# Patient Record
Sex: Female | Born: 2000
Health system: Southern US, Community
[De-identification: ages and names within clinical notes are randomized; demographics above are authoritative.]

## PROBLEM LIST (undated history)

## (undated) DIAGNOSIS — L309 Dermatitis, unspecified: Secondary | ICD-10-CM

## (undated) DIAGNOSIS — J020 Streptococcal pharyngitis: Secondary | ICD-10-CM

---
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
## 2021-02-20 ENCOUNTER — Emergency Department (HOSPITAL_BASED_OUTPATIENT_CLINIC_OR_DEPARTMENT_OTHER)
Admission: EM | Admit: 2021-02-20 | Discharge: 2021-02-20 | Disposition: A | Discharge status: Home / Self Care | Payer: 59 | Attending: Emergency Medicine | Admitting: Emergency Medicine

## 2021-02-20 ENCOUNTER — Other Ambulatory Visit: Payer: Self-pay

## 2021-02-20 DIAGNOSIS — J45909 Unspecified asthma, uncomplicated: Secondary | ICD-10-CM | POA: Insufficient documentation

## 2021-02-20 DIAGNOSIS — Z20822 Contact with and (suspected) exposure to covid-19: Secondary | ICD-10-CM | POA: Insufficient documentation

## 2021-02-20 DIAGNOSIS — R519 Headache, unspecified: Secondary | ICD-10-CM | POA: Insufficient documentation

## 2021-02-20 LAB — RESP PANEL BY RT-PCR (FLU A&B, COVID) ARPGX2
Influenza A by PCR: NEGATIVE
Influenza B by PCR: NEGATIVE
SARS Coronavirus 2 by RT PCR: NEGATIVE

## 2021-02-20 MED ORDER — ACETAMINOPHEN 325 MG PO TABS
650.0000 mg | ORAL_TABLET | Freq: Once | ORAL | Status: AC
  Administered 2021-02-20: 650 mg via ORAL
  Filled 2021-02-20: qty 2

## 2021-02-20 NOTE — ED Triage Notes (Signed)
Pt c/o headache, chills, fatigue & chest pain. States coworker recently had covid and was in contact with her.

## 2021-02-20 NOTE — ED Provider Notes (Signed)
[facility] EMERGENCY DEPARTMENT Provider Note   CSN: [id number] Arrival date & time: 02/20/21  6962     History Chief Complaint  Patient presents with   Headache    Summer Thornton is a 20 y.o. female.  The history is provided by the patient.  URI Presenting symptoms: fatigue   Presenting symptoms: no cough, no ear pain, no fever and no sore throat   Presenting symptoms comment:  Chills, fatigue, headache, exposure to covid  Severity:  Mild Onset quality:  Gradual Timing:  Intermittent Progression:  Waxing and waning Chronicity:  New Relieved by:  Nothing Worsened by:  Nothing Ineffective treatments:  None tried Associated symptoms: headaches and myalgias   Associated symptoms: no arthralgias   Risk factors: sick contacts       Past Medical History:  Diagnosis Date   Asthma    Eczema     There are no problems to display for this patient.   No past surgical history on file.   OB History   No obstetric history on file.     Family History  Problem Relation Age of Onset   Hypertension Maternal Grandmother    Fibroids Maternal Grandmother    Depression Maternal Grandmother    Breast cancer Paternal Grandmother     Social History   Tobacco Use   Smoking status: Never  Substance Use Topics   Alcohol use: No   Drug use: No    Home Medications Prior to Admission medications   Medication Sig Start Date End Date Taking? Authorizing Provider  albuterol (PROVENTIL HFA;VENTOLIN HFA) 108 (90 BASE) MCG/ACT inhaler Inhale 2 puffs into the lungs every 6 (six) hours as needed.   Patient not taking: Reported on 12/22/2019    [provider]  APRI 0.15-30 MG-MCG tablet Take 1 tablet by mouth daily. 12/17/19   [provider]  clindamycin (CLEOCIN) 300 MG capsule Take 1 capsule (300 mg total) by mouth 3 (three) times daily. X 7 days Patient not taking: Reported on 12/22/2019 02/16/17   Palumbo, April, MD  montelukast (SINGULAIR) 5 MG  chewable tablet Chew 5 mg by mouth at bedtime.   Patient not taking: Reported on 12/22/2019    [provider]  Vitamin D, Ergocalciferol, (DRISDOL) 1.25 MG (50000 UNIT) CAPS capsule Take 50,000 Units by mouth once a week. 11/09/19   [provider]    Allergies    Patient has no known allergies.  Review of Systems   Review of Systems  Constitutional:  Positive for fatigue. Negative for chills and fever.  HENT:  Negative for ear pain and sore throat.   Eyes:  Negative for pain and visual disturbance.  Respiratory:  Negative for cough and shortness of breath.   Cardiovascular:  Negative for chest pain and palpitations.  Gastrointestinal:  Negative for abdominal pain and vomiting.  Genitourinary:  Negative for dysuria and hematuria.  Musculoskeletal:  Positive for myalgias. Negative for arthralgias and back pain.  Skin:  Negative for color change and rash.  Neurological:  Positive for headaches. Negative for seizures and syncope.  All other systems reviewed and are negative.  Physical Exam Updated Vital Signs BP 125/72 (BP Location: Right Arm)   Pulse 67   Temp 98.5 F (36.9 C) (Oral)   Resp 18   Ht 5\' 4"  (1.626 m)   Wt 100.7 kg   LMP 01/28/2021   SpO2 98%   BMI 38.11 kg/m   Physical Exam Vitals and nursing note reviewed.  Constitutional:  General: She is not in acute distress.    Appearance: She is well-developed. She is not ill-appearing.  HENT:     Head: Normocephalic and atraumatic.  Eyes:     Conjunctiva/sclera: Conjunctivae normal.  Cardiovascular:     Rate and Rhythm: Normal rate and regular rhythm.     Heart sounds: Normal heart sounds. No murmur heard. Pulmonary:     Effort: Pulmonary effort is normal. No respiratory distress.     Breath sounds: Normal breath sounds.  Abdominal:     Palpations: Abdomen is soft.     Tenderness: There is no abdominal tenderness.  Musculoskeletal:     Cervical back: Neck supple.  Skin:    General: Skin  is warm and dry.  Neurological:     Mental Status: She is alert and oriented to person, place, and time.     Cranial Nerves: No cranial nerve deficit.     Sensory: No sensory deficit.     Motor: No weakness.  Psychiatric:        Mood and Affect: Mood normal.    ED Results / Procedures / Treatments   Labs (all labs ordered are listed, but only abnormal results are displayed) Labs Reviewed  RESP PANEL BY RT-PCR (FLU A&B, COVID) [id number]    EKG EKG Interpretation  Date/Time:  Tuesday February 20 2021 09:47:33 EDT Ventricular Rate:  73 PR Interval:  154 QRS Duration: 83 QT Interval:  374 QTC Calculation: 413 R Axis:   30 Text Interpretation: Sinus arrhythmia Confirmed by Virgina Norfolk ([id number]) on 02/20/2021 9:48:53 AM  Radiology No results found.  Procedures Procedures   Medications Ordered in ED Medications  acetaminophen (TYLENOL) tablet 650 mg (650 mg Oral Given 02/20/21 2500)    ED Course  I have reviewed the triage vital signs and the nursing notes.  Pertinent labs & imaging results that were available during my care of the patient were reviewed by me and considered in my medical decision making (see chart for details).    MDM Rules/Calculators/A&P                           Jelani Renton is here for evaluation of COVID after close exposure.  Normal vitals.  No fever.  Had some body aches and headache today.  Had an episode of lightheadedness.  This has resolved.  EKG shows sinus rhythm.  No ischemic changes.  Normal neurological exam.  Overall asymptomatic here.  Suspect viral process.  Given Tylenol.  Will check for COVID and influenza.  Understands return precautions.  Discharged in good condition.  No concern for acute neurologic process or acute cardiac process.  This chart was dictated using voice recognition software.  Despite best efforts to proofread,  errors can occur which can change the documentation meaning.   Final Clinical Impression(s) / ED Diagnoses Final  diagnoses:  Nonintractable headache, unspecified chronicity pattern, unspecified headache type    Rx / DC Orders ED Discharge Orders     None        Virgina Norfolk, DO 02/20/21 1020

## 2021-06-01 ENCOUNTER — Emergency Department (HOSPITAL_BASED_OUTPATIENT_CLINIC_OR_DEPARTMENT_OTHER)
Admission: EM | Admit: 2021-06-01 | Discharge: 2021-06-02 | Disposition: A | Discharge status: Home / Self Care | Payer: 59 | Attending: Emergency Medicine | Admitting: Emergency Medicine

## 2021-06-01 ENCOUNTER — Other Ambulatory Visit: Payer: Self-pay

## 2021-06-01 DIAGNOSIS — Z20822 Contact with and (suspected) exposure to covid-19: Secondary | ICD-10-CM | POA: Diagnosis not present

## 2021-06-01 DIAGNOSIS — R509 Fever, unspecified: Secondary | ICD-10-CM

## 2021-06-01 DIAGNOSIS — J039 Acute tonsillitis, unspecified: Secondary | ICD-10-CM

## 2021-06-01 DIAGNOSIS — J45909 Unspecified asthma, uncomplicated: Secondary | ICD-10-CM | POA: Diagnosis not present

## 2021-06-01 MED ORDER — ACETAMINOPHEN 160 MG/5ML PO SOLN
650.0000 mg | Freq: Once | ORAL | Status: AC
  Administered 2021-06-01: 650 mg via ORAL
  Filled 2021-06-01: qty 20.3

## 2021-06-01 NOTE — ED Notes (Signed)
MD made aware of PT

## 2021-06-01 NOTE — ED Triage Notes (Signed)
C/o sore throat x 3 days.

## 2021-06-02 ENCOUNTER — Emergency Department (HOSPITAL_BASED_OUTPATIENT_CLINIC_OR_DEPARTMENT_OTHER): Payer: 59

## 2021-06-02 LAB — CBC WITH DIFFERENTIAL/PLATELET
Abs Immature Granulocytes: 0.07 10*3/uL (ref 0.00–0.07)
Basophils Absolute: 0 10*3/uL (ref 0.0–0.1)
Basophils Relative: 0 %
Eosinophils Absolute: 0.1 10*3/uL (ref 0.0–0.5)
Eosinophils Relative: 0 %
HCT: 34.6 % — ABNORMAL LOW (ref 36.0–46.0)
Hemoglobin: 11.2 g/dL — ABNORMAL LOW (ref 12.0–15.0)
Immature Granulocytes: 1 %
Lymphocytes Relative: 8 %
Lymphs Abs: 1.2 10*3/uL (ref 0.7–4.0)
MCH: 25.6 pg — ABNORMAL LOW (ref 26.0–34.0)
MCHC: 32.4 g/dL (ref 30.0–36.0)
MCV: 79 fL — ABNORMAL LOW (ref 80.0–100.0)
Monocytes Absolute: 1.1 10*3/uL — ABNORMAL HIGH (ref 0.1–1.0)
Monocytes Relative: 7 %
Neutro Abs: 12.3 10*3/uL — ABNORMAL HIGH (ref 1.7–7.7)
Neutrophils Relative %: 84 %
Platelets: 276 10*3/uL (ref 150–400)
RBC: 4.38 MIL/uL (ref 3.87–5.11)
RDW: 16.5 % — ABNORMAL HIGH (ref 11.5–15.5)
WBC: 14.7 10*3/uL — ABNORMAL HIGH (ref 4.0–10.5)
nRBC: 0 % (ref 0.0–0.2)

## 2021-06-02 LAB — COMPREHENSIVE METABOLIC PANEL
ALT: 11 U/L (ref 0–44)
AST: 14 U/L — ABNORMAL LOW (ref 15–41)
Albumin: 3.7 g/dL (ref 3.5–5.0)
Alkaline Phosphatase: 52 U/L (ref 38–126)
Anion gap: 12 (ref 5–15)
BUN: 9 mg/dL (ref 6–20)
CO2: 23 mmol/L (ref 22–32)
Calcium: 8.5 mg/dL — ABNORMAL LOW (ref 8.9–10.3)
Chloride: 103 mmol/L (ref 98–111)
Creatinine, Ser: 0.73 mg/dL (ref 0.44–1.00)
GFR, Estimated: >60 mL/min (ref >60)
Glucose, Bld: 91 mg/dL (ref 70–99)
Potassium: 3.2 mmol/L — ABNORMAL LOW (ref 3.5–5.1)
Sodium: 138 mmol/L (ref 135–145)
Total Bilirubin: 0.6 mg/dL (ref 0.3–1.2)
Total Protein: 8 g/dL (ref 6.5–8.1)

## 2021-06-02 LAB — LACTIC ACID, PLASMA: Lactic Acid, Venous: 1 mmol/L (ref 0.5–1.9)

## 2021-06-02 LAB — RESP PANEL BY RT-PCR (FLU A&B, COVID) ARPGX2
Influenza A by PCR: NEGATIVE
Influenza B by PCR: NEGATIVE
SARS Coronavirus 2 by RT PCR: NEGATIVE

## 2021-06-02 LAB — GROUP A STREP BY PCR: Group A Strep by PCR: NOT DETECTED

## 2021-06-02 LAB — MONONUCLEOSIS SCREEN: Mono Screen: NEGATIVE

## 2021-06-02 MED ORDER — ONDANSETRON 8 MG PO TBDP: ORAL_TABLET | ORAL | 0 refills | Status: AC

## 2021-06-02 MED ORDER — CLINDAMYCIN HCL 300 MG PO CAPS
300.0000 mg | ORAL_CAPSULE | Freq: Four times a day (QID) | ORAL | 0 refills | Status: DC
Start: 2021-06-02 — End: 2022-02-09

## 2021-06-02 MED ORDER — ONDANSETRON HCL 4 MG/2ML IJ SOLN
4.0000 mg | Freq: Once | INTRAMUSCULAR | Status: AC
  Administered 2021-06-02: 4 mg via INTRAVENOUS
  Filled 2021-06-02: qty 2

## 2021-06-02 MED ORDER — KETOROLAC TROMETHAMINE 30 MG/ML IJ SOLN
30.0000 mg | Freq: Once | INTRAMUSCULAR | Status: AC
  Administered 2021-06-02: 30 mg via INTRAVENOUS
  Filled 2021-06-02: qty 1

## 2021-06-02 MED ORDER — DEXAMETHASONE SODIUM PHOSPHATE 10 MG/ML IJ SOLN
15.0000 mg | Freq: Once | INTRAMUSCULAR | Status: AC
  Administered 2021-06-02: 15 mg via INTRAVENOUS
  Filled 2021-06-02: qty 2

## 2021-06-02 MED ORDER — CLINDAMYCIN PHOSPHATE 600 MG/50ML IV SOLN
600.0000 mg | Freq: Once | INTRAVENOUS | Status: AC
  Administered 2021-06-02: 600 mg via INTRAVENOUS
  Filled 2021-06-02: qty 50

## 2021-06-02 MED ORDER — PREDNISONE 10 MG PO TABS: 20.0000 mg | ORAL_TABLET | Freq: Two times a day (BID) | ORAL | 0 refills | Status: AC

## 2021-06-02 MED ORDER — SODIUM CHLORIDE 0.9 % IV BOLUS
1000.0000 mL | Freq: Once | INTRAVENOUS | Status: AC
  Administered 2021-06-02: 1000 mL via INTRAVENOUS

## 2021-06-02 NOTE — ED Provider Notes (Signed)
[facility] EMERGENCY DEPARTMENT Provider Note   CSN: [id number] Arrival date & time: 06/01/21  2305     History Chief Complaint  Patient presents with   Sore Throat   Fever    Summer Thornton is a 20 y.o. female.  Patient is a 20 year old female with history of asthma.  She presents today for evaluation of sore throat.  This has been worsening over the past several days.  She reports fever at home.  She describes pain with swallowing but denies difficulty breathing.  She denies cough.  The history is provided by the patient.  Sore Throat This is a new problem. The current episode started 2 days ago. The problem occurs constantly. The problem has been rapidly worsening. The symptoms are aggravated by swallowing. Nothing relieves the symptoms. She has tried nothing for the symptoms.  Fever     Past Medical History:  Diagnosis Date   Asthma    Eczema     There are no problems to display for this patient.   No past surgical history on file.   OB History   No obstetric history on file.     Family History  Problem Relation Age of Onset   Hypertension Maternal Grandmother    Fibroids Maternal Grandmother    Depression Maternal Grandmother    Breast cancer Paternal Grandmother     Social History   Tobacco Use   Smoking status: Never  Substance Use Topics   Alcohol use: No   Drug use: No    Home Medications Prior to Admission medications   Medication Sig Start Date End Date Taking? Authorizing Provider  albuterol (PROVENTIL HFA;VENTOLIN HFA) 108 (90 BASE) MCG/ACT inhaler Inhale 2 puffs into the lungs every 6 (six) hours as needed.   Patient not taking: Reported on 12/22/2019    [provider]  APRI 0.15-30 MG-MCG tablet Take 1 tablet by mouth daily. 12/17/19   [provider]  clindamycin (CLEOCIN) 300 MG capsule Take 1 capsule (300 mg total) by mouth 3 (three) times daily. X 7 days Patient not taking: Reported on 12/22/2019 02/16/17    Palumbo, April, MD  montelukast (SINGULAIR) 5 MG chewable tablet Chew 5 mg by mouth at bedtime.   Patient not taking: Reported on 12/22/2019    [provider]  Vitamin D, Ergocalciferol, (DRISDOL) 1.25 MG (50000 UNIT) CAPS capsule Take 50,000 Units by mouth once a week. 11/09/19   [provider]    Allergies    Patient has no known allergies.  Review of Systems   Review of Systems  Constitutional:  Positive for fever.  All other systems reviewed and are negative.  Physical Exam Updated Vital Signs BP 135/85   Pulse 90   Temp (!) 101.5 F (38.6 C) (Oral)   Resp 18   Ht 5\' 6"  (1.676 m)   Wt 99.8 kg   LMP 05/09/2021   SpO2 100%   BMI 35.51 kg/m   Physical Exam Vitals and nursing note reviewed.  Constitutional:      General: She is not in acute distress.    Appearance: She is well-developed. She is not diaphoretic.  HENT:     Head: Normocephalic and atraumatic.     Mouth/Throat:     Mouth: Mucous membranes are moist.     Pharynx: Uvula midline. Pharyngeal swelling, oropharyngeal exudate and posterior oropharyngeal erythema present.     Comments: Tonsils are swollen bilaterally, both with exudates and erythema.  There is no deviation or  obvious evidence for abscess. Cardiovascular:     Rate and Rhythm: Normal rate and regular rhythm.     Heart sounds: No murmur heard.   No friction rub. No gallop.  Pulmonary:     Effort: Pulmonary effort is normal. No respiratory distress.     Breath sounds: Normal breath sounds. No wheezing.  Abdominal:     General: Bowel sounds are normal. There is no distension.     Palpations: Abdomen is soft.     Tenderness: There is no abdominal tenderness.  Musculoskeletal:        General: Normal range of motion.     Cervical back: Normal range of motion and neck supple.  Skin:    General: Skin is warm and dry.  Neurological:     General: No focal deficit present.     Mental Status: She is alert and oriented to person,  place, and time.    ED Results / Procedures / Treatments   Labs (all labs ordered are listed, but only abnormal results are displayed) Labs Reviewed  CBC WITH DIFFERENTIAL/PLATELET - Abnormal; Notable for the following components:      Result Value   WBC 14.7 (*)    Hemoglobin 11.2 (*)    HCT 34.6 (*)    MCV 79.0 (*)    MCH 25.6 (*)    RDW 16.5 (*)    Neutro Abs 12.3 (*)    Monocytes Absolute 1.1 (*)    All other components within normal limits  GROUP A STREP BY PCR  RESP PANEL BY RT-PCR (FLU A&B, COVID) ARPGX2  CULTURE, BLOOD (ROUTINE X 2)  CULTURE, BLOOD (ROUTINE X 2)  LACTIC ACID, PLASMA  COMPREHENSIVE METABOLIC PANEL  LACTIC ACID, PLASMA  URINALYSIS, ROUTINE W REFLEX MICROSCOPIC  PREGNANCY, URINE  MONONUCLEOSIS SCREEN    EKG EKG Interpretation  Date/Time:  Friday June 01 2021 23:36:12 EST Ventricular Rate:  96 PR Interval:  159 QRS Duration: 76 QT Interval:  323 QTC Calculation: 409 R Axis:   52 Text Interpretation: Sinus rhythm Normal ECG Confirmed by Geoffery Lyons ([id number]) on 06/01/2021 11:42:41 PM  Radiology No results found.  Procedures Procedures   Medications Ordered in ED Medications  sodium chloride 0.9 % bolus 1,000 mL (has no administration in time range)  dexamethasone (DECADRON) injection 15 mg (has no administration in time range)  ketorolac (TORADOL) 30 MG/ML injection 30 mg (has no administration in time range)  acetaminophen (TYLENOL) 160 MG/5ML solution 650 mg (650 mg Oral Given 06/01/21 2355)    ED Course  I have reviewed the triage vital signs and the nursing notes.  Pertinent labs & imaging results that were available during my care of the patient were reviewed by me and considered in my medical decision making (see chart for details).    MDM Rules/Calculators/A&P  Patient presenting here with sore throat and fever over the past several days.  On exam, she has exudative tonsillitis and enlarged tonsils, but no stridor or  respiratory compromise.  Strep and monotest both are unremarkable.  I am uncertain as to the etiology, but patient appears to have tonsillitis.  This will be treated with IV clindamycin and Decadron.  Patient has been observed here for several hours and seems to be feeling better after receiving fluids and medications.  At this point, I feel as though she can safely be discharged.  I will prescribe clindamycin and prednisone as well as Zofran for nausea.  Patient to return as needed if symptoms worsen  or change.  Final Clinical Impression(s) / ED Diagnoses Final diagnoses:  None    Rx / DC Orders ED Discharge Orders     None        Geoffery Lyons, MD 06/02/21 [phone]

## 2021-06-02 NOTE — Discharge Instructions (Addendum)
Begin taking clindamycin and prednisone as prescribed.  Begin taking Zofran as prescribed as needed for nausea.  Return to the emergency department if you develop difficulty breathing, inability to swallow, severe chest pains, or other new and concerning symptoms.

## 2021-06-07 LAB — CULTURE, BLOOD (ROUTINE X 2)
Culture: NO GROWTH
Special Requests: ADEQUATE

## 2021-12-04 ENCOUNTER — Other Ambulatory Visit (HOSPITAL_BASED_OUTPATIENT_CLINIC_OR_DEPARTMENT_OTHER): Payer: Self-pay

## 2022-02-09 ENCOUNTER — Encounter (HOSPITAL_BASED_OUTPATIENT_CLINIC_OR_DEPARTMENT_OTHER): Payer: Self-pay | Admitting: Emergency Medicine

## 2022-02-09 ENCOUNTER — Emergency Department (HOSPITAL_BASED_OUTPATIENT_CLINIC_OR_DEPARTMENT_OTHER)
Admission: EM | Admit: 2022-02-09 | Discharge: 2022-02-09 | Disposition: A | Discharge status: Home / Self Care | Payer: No Typology Code available for payment source | Attending: Emergency Medicine | Admitting: Emergency Medicine

## 2022-02-09 DIAGNOSIS — N760 Acute vaginitis: Secondary | ICD-10-CM | POA: Insufficient documentation

## 2022-02-09 DIAGNOSIS — N898 Other specified noninflammatory disorders of vagina: Secondary | ICD-10-CM | POA: Diagnosis present

## 2022-02-09 DIAGNOSIS — B9689 Other specified bacterial agents as the cause of diseases classified elsewhere: Secondary | ICD-10-CM | POA: Insufficient documentation

## 2022-02-09 DIAGNOSIS — J029 Acute pharyngitis, unspecified: Secondary | ICD-10-CM | POA: Insufficient documentation

## 2022-02-09 DIAGNOSIS — Z20822 Contact with and (suspected) exposure to covid-19: Secondary | ICD-10-CM | POA: Diagnosis not present

## 2022-02-09 HISTORY — DX: Streptococcal pharyngitis: J02.0

## 2022-02-09 LAB — WET PREP, GENITAL
Sperm: NONE SEEN
Trich, Wet Prep: NONE SEEN
WBC, Wet Prep HPF POC: <10 (ref <10)
Yeast Wet Prep HPF POC: NONE SEEN

## 2022-02-09 LAB — URINALYSIS, MICROSCOPIC (REFLEX)

## 2022-02-09 LAB — RESP PANEL BY RT-PCR (FLU A&B, COVID) ARPGX2
Influenza A by PCR: NEGATIVE
Influenza B by PCR: NEGATIVE
SARS Coronavirus 2 by RT PCR: NEGATIVE

## 2022-02-09 LAB — URINALYSIS, ROUTINE W REFLEX MICROSCOPIC
Bilirubin Urine: NEGATIVE
Glucose, UA: NEGATIVE mg/dL
Ketones, ur: NEGATIVE mg/dL
Leukocytes,Ua: NEGATIVE
Nitrite: NEGATIVE
Protein, ur: 30 mg/dL — AB
Specific Gravity, Urine: 1.02 (ref 1.005–1.030)
pH: 7 (ref 5.0–8.0)

## 2022-02-09 LAB — MONONUCLEOSIS SCREEN: Mono Screen: NEGATIVE

## 2022-02-09 LAB — PREGNANCY, URINE: Preg Test, Ur: NEGATIVE

## 2022-02-09 LAB — GROUP A STREP BY PCR: Group A Strep by PCR: NOT DETECTED

## 2022-02-09 MED ORDER — PENICILLIN G BENZATHINE 1200000 UNIT/2ML IM SUSY
1.2000 10*6.[IU] | PREFILLED_SYRINGE | Freq: Once | INTRAMUSCULAR | Status: AC
  Administered 2022-02-09: 1.2 10*6.[IU] via INTRAMUSCULAR
  Filled 2022-02-09: qty 2

## 2022-02-09 MED ORDER — LIDOCAINE VISCOUS HCL 2 % MT SOLN: 15.0000 mL | OROMUCOSAL | 0 refills | Status: AC | PRN

## 2022-02-09 MED ORDER — METRONIDAZOLE 500 MG PO TABS: 500.0000 mg | ORAL_TABLET | Freq: Two times a day (BID) | ORAL | 0 refills | Status: AC

## 2022-02-09 MED ORDER — LIDOCAINE VISCOUS HCL 2 % MT SOLN
15.0000 mL | Freq: Once | OROMUCOSAL | Status: AC
  Administered 2022-02-09: 15 mL via OROMUCOSAL
  Filled 2022-02-09: qty 15

## 2022-02-09 NOTE — ED Provider Notes (Signed)
[facility] EMERGENCY DEPARTMENT Provider Note   CSN: [id number] Arrival date & time: 02/09/22  1235     History  Chief Complaint  Patient presents with   Sore Throat    Summer Thornton is a 21 y.o. female with h/o recurrent strep presents to the ED for evaluation of sore throat for the past 2 days and vaginal discharge for the past month.  Denies any vaginal itching.  She reports that its a thin white to clear discharge.  No abdominal pain.  No nausea, vomiting, constipation, diarrhea, or fevers.  She reports that she has a history of strep throat and was told that she needed to have her tonsils taken out, but has not followed up.  She reports that she needs any referral for an ENT doctor.  She denies any drooling or trouble swallowing, she does report some pain with swallowing, but is still able to eat and drink.  Denies any nasal congestion or rhinorrhea.  Denies any chest pain or shortness of breath.  Patient reports that she had a new sexual partner in the middle of July, she reports that she has been tested for STDs since then and came back negative.  She reports that she has still had symptoms and would like to be tested again.  No known drug allergies.   Sore Throat Pertinent negatives include no chest pain, no abdominal pain and no shortness of breath.       Home Medications Prior to Admission medications   Medication Sig Start Date End Date Taking? Authorizing Provider  lidocaine (XYLOCAINE) 2 % solution Use as directed 15 mLs in the mouth or throat as needed for mouth pain. 02/09/22  Yes Achille Rich, PA-C  metroNIDAZOLE (FLAGYL) 500 MG tablet Take 1 tablet (500 mg total) by mouth 2 (two) times daily. 02/09/22  Yes Achille Rich, PA-C  albuterol (PROVENTIL HFA;VENTOLIN HFA) 108 (90 BASE) MCG/ACT inhaler Inhale 2 puffs into the lungs every 6 (six) hours as needed.   Patient not taking: Reported on 12/22/2019    [provider]  APRI 0.15-30 MG-MCG tablet Take 1  tablet by mouth daily. 12/17/19   [provider]  montelukast (SINGULAIR) 5 MG chewable tablet Chew 5 mg by mouth at bedtime.   Patient not taking: Reported on 12/22/2019    [provider]  ondansetron (ZOFRAN-ODT) 8 MG disintegrating tablet 8mg  ODT q4 hours prn nausea 06/02/21   14/3/22, MD  predniSONE (DELTASONE) 10 MG tablet Take 2 tablets (20 mg total) by mouth 2 (two) times daily with a meal. 06/02/21   14/3/22, MD  Vitamin D, Ergocalciferol, (DRISDOL) 1.25 MG (50000 UNIT) CAPS capsule Take 50,000 Units by mouth once a week. 11/09/19   [provider]      Allergies    Patient has no known allergies.    Review of Systems   Review of Systems  Constitutional:  Negative for chills and fever.  HENT:  Positive for sore throat. Negative for congestion and rhinorrhea.   Respiratory:  Negative for shortness of breath.   Cardiovascular:  Negative for chest pain.  Gastrointestinal:  Negative for abdominal pain, constipation, diarrhea, nausea and vomiting.  Genitourinary:  Positive for vaginal discharge. Negative for dysuria, hematuria, pelvic pain and vaginal pain.    Physical Exam Updated Vital Signs BP 120/81   Pulse 65   Temp 98.9 F (37.2 C) (Oral)   Resp 18   Ht 5\' 4"  (1.626 m)   LMP 01/11/2022  SpO2 99%   BMI 37.76 kg/m  Physical Exam Vitals and nursing note reviewed. Exam conducted with a chaperone present (Mashayla, Tech).  Constitutional:      Appearance: Normal appearance.  HENT:     Head: Normocephalic and atraumatic.     Mouth/Throat:     Mouth: Mucous membranes are moist.     Pharynx: Uvula midline.     Comments: Tonsils with erythema and exudate bilaterally.  2+.  Uvula is midline.  No soft palate deviation.  Airway is patent.  Patient is speaking in full sentences with ease.  Controlling secretions.  Moist mucous membranes. Eyes:     General: No scleral icterus. Neck:     Comments: Left tonsillar adenopathy.  No increased  warmth or overlying erythema. Cardiovascular:     Rate and Rhythm: Normal rate and regular rhythm.  Pulmonary:     Effort: Pulmonary effort is normal.     Breath sounds: Normal breath sounds.  Abdominal:     General: Abdomen is flat. Bowel sounds are normal.     Palpations: Abdomen is soft.     Tenderness: There is no abdominal tenderness. There is no guarding or rebound.  Genitourinary:    Exam position: Knee-chest position.     Labia:        Right: No rash, tenderness or lesion.        Left: No rash, tenderness or lesion.      Comments: Mild bleeding noted. Thin white discharge noted. No CMT. No friability. No lesions noted.  Musculoskeletal:        General: No deformity.     Cervical back: Normal range of motion.  Lymphadenopathy:     Cervical: Cervical adenopathy present.  Skin:    General: Skin is warm and dry.  Neurological:     General: No focal deficit present.     Mental Status: She is alert. Mental status is at baseline.     ED Results / Procedures / Treatments   Labs (all labs ordered are listed, but only abnormal results are displayed) Labs Reviewed  WET PREP, GENITAL - Abnormal; Notable for the following components:      Result Value   Clue Cells Wet Prep HPF POC PRESENT (*)    All other components within normal limits  URINALYSIS, ROUTINE W REFLEX MICROSCOPIC - Abnormal; Notable for the following components:   APPearance HAZY (*)    Hgb urine dipstick SMALL (*)    Protein, ur 30 (*)    All other components within normal limits  URINALYSIS, MICROSCOPIC (REFLEX) - Abnormal; Notable for the following components:   Bacteria, UA MANY (*)    All other components within normal limits  GROUP A STREP BY PCR  RESP PANEL BY RT-PCR (FLU A&B, COVID) ARPGX2  URINE CULTURE  PREGNANCY, URINE  MONONUCLEOSIS SCREEN  GC/CHLAMYDIA PROBE AMP ([place]) NOT [facility]  GC/CHLAMYDIA PROBE AMP ([place]) NOT AT [facility]    EKG None  Radiology No results  found.  Procedures Procedures    Medications Ordered in ED Medications  lidocaine (XYLOCAINE) 2 % viscous mouth solution 15 mL (15 mLs Mouth/Throat Given 02/09/22 1348)  penicillin g benzathine (BICILLIN LA) 1200000 UNIT/2ML injection 1.2 Million Units (1.2 Million Units Intramuscular Given 02/09/22 1503)    ED Course/ Medical Decision Making/ A&P                           Medical Decision Making Amount  and/or Complexity of Data Reviewed Labs: ordered.  Risk Prescription drug management.   21 year old female presents the emergency room for evaluation of sore throat and vaginal discharge.  Differential diagnosis includes is not limited to viral pharyngitis, strep pharyngitis, PTA, STD, PID, BV, ovulation discharge.  Vital signs are unremarkable.  Physical exam as listed above.  I independently reviewed and interpreted the patient's labs.  Urinalysis shows hazy urine with small methemoglobin.  There is 30 protein.  No nitrites or leukocytes.  There is many bacteria however only 0-5 white blood cells seen.  Could be dirty catch or possible STD.  Wet prep shows clue cells but no white blood cells seen.  Pregnancy test is negative.  COVID and flu negative.  Monotest is negative.  GC of the throat is pending.  Urine culture pending.  Gonorrhea and committee of the vagina pending as well.  I discussed with the patient empiric treatment for STDs, patient reports that she would like to wait for the test results come back.  This is reasonable.  I give the patient some lidocaine to help with her sore throat.  I had a shared decision-making with the patient about 10 days of oral medication versus a one-time dosing of Bicillin.  Patient like to proceed with the Bicillin shot.  I think this is reasonable.  On reevaluation, patient reports that she is feeling after the lidocaine.  I will send her home with some viscous lidocaine.  Given the clue cells seen on her wet prep as well as her complaint of  vaginal discharge, I will place patient on metronidazole twice daily for next 7 days to treat for bacterial vaginosis.  Gave her a follow-up for ENT for her to follow-up with for possible tonsillectomy.  We discussed strict return precautions and red flag symptoms.  Patient verbalized understanding and agrees to the plan.  Patient is stable and being discharged home in good condition.  Final Clinical Impression(s) / ED Diagnoses Final diagnoses:  Pharyngitis, unspecified etiology  BV (bacterial vaginosis)    Rx / DC Orders ED Discharge Orders          Ordered    metroNIDAZOLE (FLAGYL) 500 MG tablet  2 times daily        02/09/22 1551    lidocaine (XYLOCAINE) 2 % solution  As needed        02/09/22 1612              Achille Rich, PA-C 02/09/22 1622    Gwyneth Sprout, MD 02/10/22 [id number]

## 2022-02-09 NOTE — ED Notes (Signed)
Lab to add culture to urine sample

## 2022-02-09 NOTE — Discharge Instructions (Addendum)
You were seen in the ED for eval of your sore throat and vaginal discharge.  For this, we gave you a Bicillin shot for antibiotic for your sore throat.  Additionally, there was some clue cells seen with your exam.  For this, I will place you on metronidazole that you will take twice daily for the next 7 days.  Please make sure to not drink alcohol while on this medication as it can make you very ill.  You will be contacted in the next few days for results of your gonorrhea and Chlamydia.  Please make sure you are abstaining from sex as do not reinfect yourself.  If you have any concern, new worsening symptom, please return to the emergency department for re-evaluation.   I have listed the information for Dr. Jenne Pane in here for you to call and schedule an appointment with.   Contact a doctor if: You have large, tender lumps in your neck. You have a rash. You cough up green, yellow-brown, or bloody spit. Get help right away if: You have a stiff neck. You drool or cannot swallow liquids. You cannot drink or take medicines without vomiting. You have very bad pain that does not go away with medicine. You have problems breathing, and it is not from a stuffy nose. You have new pain and swelling in your knees, ankles, wrists, or elbows. These symptoms may be an emergency. Get help right away. Call your local emergency services (911 in the [place] Do not wait to see if the symptoms will go away. Do not drive yourself to the hospital.

## 2022-02-09 NOTE — ED Notes (Signed)
Patient is not able to urinate at this time  

## 2022-02-09 NOTE — ED Triage Notes (Addendum)
Pt reports sore throat x 2d; hx of strep; pt also wants to be tested for stds; unknown exposure, but sts she has a vaginal discharge

## 2022-02-11 LAB — GC/CHLAMYDIA PROBE AMP (~~LOC~~) NOT AT ARMC
Chlamydia: NEGATIVE
Comment: NEGATIVE
Comment: NORMAL
Neisseria Gonorrhea: NEGATIVE

## 2022-02-11 LAB — URINE CULTURE: Culture: <10000 — AB

## 2022-02-11 LAB — GONOCOCCUS CULTURE

## 2022-02-12 ENCOUNTER — Telehealth: Payer: Self-pay

## 2022-02-12 NOTE — Telephone Encounter (Signed)
Post ED Visit - Positive Culture Follow-up  Culture report reviewed by antimicrobial stewardship pharmacist: [facility] [x]  , Pharm.D. []  Estill Batten, Pharm.D., BCPS AQ-ID []  , Pharm.D., BCPS []  Celedonio Miyamoto, Pharm.D., BCPS []  [place], Garvin Fila.D., BCPS, AAHIVP []  , Pharm.D., BCPS, AAHIVP []  Georgina Pillion, PharmD, BCPS []  , PharmD, BCPS []  [place], PharmD, BCPS []  [address], PharmD []  , PharmD, BCPS []  Estella Husk, PharmD  Pharmacy Team []  Lysle Pearl, PharmD []  , PharmD []  Phillips Climes, PharmD []  , Rph []  Agapito Games) , PharmD []  Verlan Friends, PharmD []  , PharmD []  Mervyn Gay, PharmD []  , PharmD []  Vinnie Level, PharmD []  [facility], PharmD []  , PharmD []  Len Childs, PharmD   Positive Gonococcus culture Treated with Metronidazole, organism sensitive to the same and no further patient follow-up is required at this time.  02/12/2022, 11:20 AM

## 2022-02-13 ENCOUNTER — Encounter (HOSPITAL_BASED_OUTPATIENT_CLINIC_OR_DEPARTMENT_OTHER): Payer: Self-pay | Admitting: Emergency Medicine

## 2022-02-13 ENCOUNTER — Emergency Department (HOSPITAL_BASED_OUTPATIENT_CLINIC_OR_DEPARTMENT_OTHER)
Admission: EM | Admit: 2022-02-13 | Discharge: 2022-02-13 | Disposition: A | Discharge status: Home / Self Care | Payer: No Typology Code available for payment source | Attending: Emergency Medicine | Admitting: Emergency Medicine

## 2022-02-13 DIAGNOSIS — J029 Acute pharyngitis, unspecified: Secondary | ICD-10-CM | POA: Diagnosis not present

## 2022-02-13 MED ORDER — DEXAMETHASONE 4 MG PO TABS
10.0000 mg | ORAL_TABLET | Freq: Once | ORAL | Status: AC
  Administered 2022-02-13: 10 mg via ORAL
  Filled 2022-02-13: qty 3

## 2022-02-13 NOTE — ED Provider Notes (Signed)
[facility] EMERGENCY DEPARTMENT Provider Note   CSN: [id number] Arrival date & time: 02/13/22  1856     History  Chief Complaint  Patient presents with   Sore Throat    Summer Thornton is a 21 y.o. female.  21 yo F with a chief complaints of a sore throat.  This been ongoing issue.  Has been diagnosed with strep multiple times in the past.  Was actually seen about 48 hours ago in the ED and had a negative strep negative mono negative gonorrhea swab.  She was given Bicillin at that time.  She is still having a sore throat and so came back to the ER for evaluation.  She does feel like her lymphadenopathy is improved slightly.  Denies any difficulty swallowing.   Sore Throat       Home Medications Prior to Admission medications   Medication Sig Start Date End Date Taking? Authorizing Provider  albuterol (PROVENTIL HFA;VENTOLIN HFA) 108 (90 BASE) MCG/ACT inhaler Inhale 2 puffs into the lungs every 6 (six) hours as needed.   Patient not taking: Reported on 12/22/2019    [provider]  APRI 0.15-30 MG-MCG tablet Take 1 tablet by mouth daily. 12/17/19   [provider]  lidocaine (XYLOCAINE) 2 % solution Use as directed 15 mLs in the mouth or throat as needed for mouth pain. 02/09/22   Achille Rich, PA-C  metroNIDAZOLE (FLAGYL) 500 MG tablet Take 1 tablet (500 mg total) by mouth 2 (two) times daily. 02/09/22   Achille Rich, PA-C  montelukast (SINGULAIR) 5 MG chewable tablet Chew 5 mg by mouth at bedtime.   Patient not taking: Reported on 12/22/2019    [provider]  ondansetron (ZOFRAN-ODT) 8 MG disintegrating tablet 8mg  ODT q4 hours prn nausea 06/02/21   14/3/22, MD  predniSONE (DELTASONE) 10 MG tablet Take 2 tablets (20 mg total) by mouth 2 (two) times daily with a meal. 06/02/21   14/3/22, MD  Vitamin D, Ergocalciferol, (DRISDOL) 1.25 MG (50000 UNIT) CAPS capsule Take 50,000 Units by mouth once a week. 11/09/19   [provider]       Allergies    Patient has no known allergies.    Review of Systems   Review of Systems  Physical Exam Updated Vital Signs BP 108/74 (BP Location: Right Arm)   Pulse 85   Temp 98.6 F (37 C) (Oral)   Resp 16   Ht 5\' 4"  (1.626 m)   Wt 99.8 kg   LMP 01/11/2022   SpO2 100%   BMI 37.76 kg/m  Physical Exam Vitals and nursing note reviewed.  Constitutional:      General: She is not in acute distress.    Appearance: She is well-developed. She is not diaphoretic.  HENT:     Head: Normocephalic and atraumatic.     Mouth/Throat:     Tonsils: Tonsillar exudate and tonsillar abscess present. 3+ on the right. 3+ on the left.     Comments: Bilateral tonsillar swelling without obvious posterior pharyngeal erythema or edema.  Uvula is midline tolerating secretions without difficulty. Eyes:     Pupils: Pupils are equal, round, and reactive to light.  Cardiovascular:     Rate and Rhythm: Normal rate and regular rhythm.     Heart sounds: No murmur heard.    No friction rub. No gallop.  Pulmonary:     Effort: Pulmonary effort is normal.     Breath sounds: No wheezing or rales.  Abdominal:  General: There is no distension.     Palpations: Abdomen is soft.     Tenderness: There is no abdominal tenderness.  Musculoskeletal:        General: No tenderness.     Cervical back: Normal range of motion and neck supple.  Skin:    General: Skin is warm and dry.  Neurological:     Mental Status: She is alert and oriented to person, place, and time.  Psychiatric:        Behavior: Behavior normal.     ED Results / Procedures / Treatments   Labs (all labs ordered are listed, but only abnormal results are displayed) Labs Reviewed - No data to display  EKG None  Radiology No results found.  Procedures Procedures    Medications Ordered in ED Medications  dexamethasone (DECADRON) tablet 10 mg (10 mg Oral Given 02/13/22 2003)    ED Course/ Medical Decision Making/ A&P                            Medical Decision Making Risk Prescription drug management.   21 yo F with a chief complaint of a sore throat.  She has had recurrent issues with strep pharyngitis.  Was seen about 48 hours ago and had a negative strep test was given Bicillin.  Since then she does not feel like she is any better but has had some less lymphadenopathy.  On my exam she does have tonsillar swelling with exudates.  We will give a dose of Decadron here.  She is scheduled follow-up with ENT in 2 days time.  9:14 PM:  I have discussed the diagnosis/risks/treatment options with the patient.  Evaluation and diagnostic testing in the emergency department does not suggest an emergent condition requiring admission or immediate intervention beyond what has been performed at this time.  They will follow up with  ENT. We also discussed returning to the ED immediately if new or worsening sx occur. We discussed the sx which are most concerning (e.g., sudden worsening pain, fever, inability to tolerate by mouth) that necessitate immediate return. Medications administered to the patient during their visit and any new prescriptions provided to the patient are listed below.  Medications given during this visit Medications  dexamethasone (DECADRON) tablet 10 mg (10 mg Oral Given 02/13/22 2003)     The patient appears reasonably screen and/or stabilized for discharge and I doubt any other medical condition or other [facility] requiring further screening, evaluation, or treatment in the ED at this time prior to discharge.          Final Clinical Impression(s) / ED Diagnoses Final diagnoses:  Pharyngitis, unspecified etiology    Rx / DC Orders ED Discharge Orders     None         Melene Plan, DO 02/13/22 2114

## 2022-02-13 NOTE — ED Triage Notes (Signed)
Pt returns with continued sore throat; sts tonsils are still swollen and can only eat applesauce

## 2022-02-13 NOTE — ED Notes (Signed)
Written and verbal inst to pt  Verbalized an understanding  To home with family  

## 2022-02-13 NOTE — Discharge Instructions (Signed)
Return for difficulty swallowing.  Follow-up with ENT as scheduled.

## 2022-03-04 IMAGING — DX DG CHEST 2V
2 series · 2 of 2 positions shown · non-contrast
Comparison: 09/04/2009

CLINICAL DATA: Sepsis

EXAM:
CHEST - 2 VIEW

[chest pa]
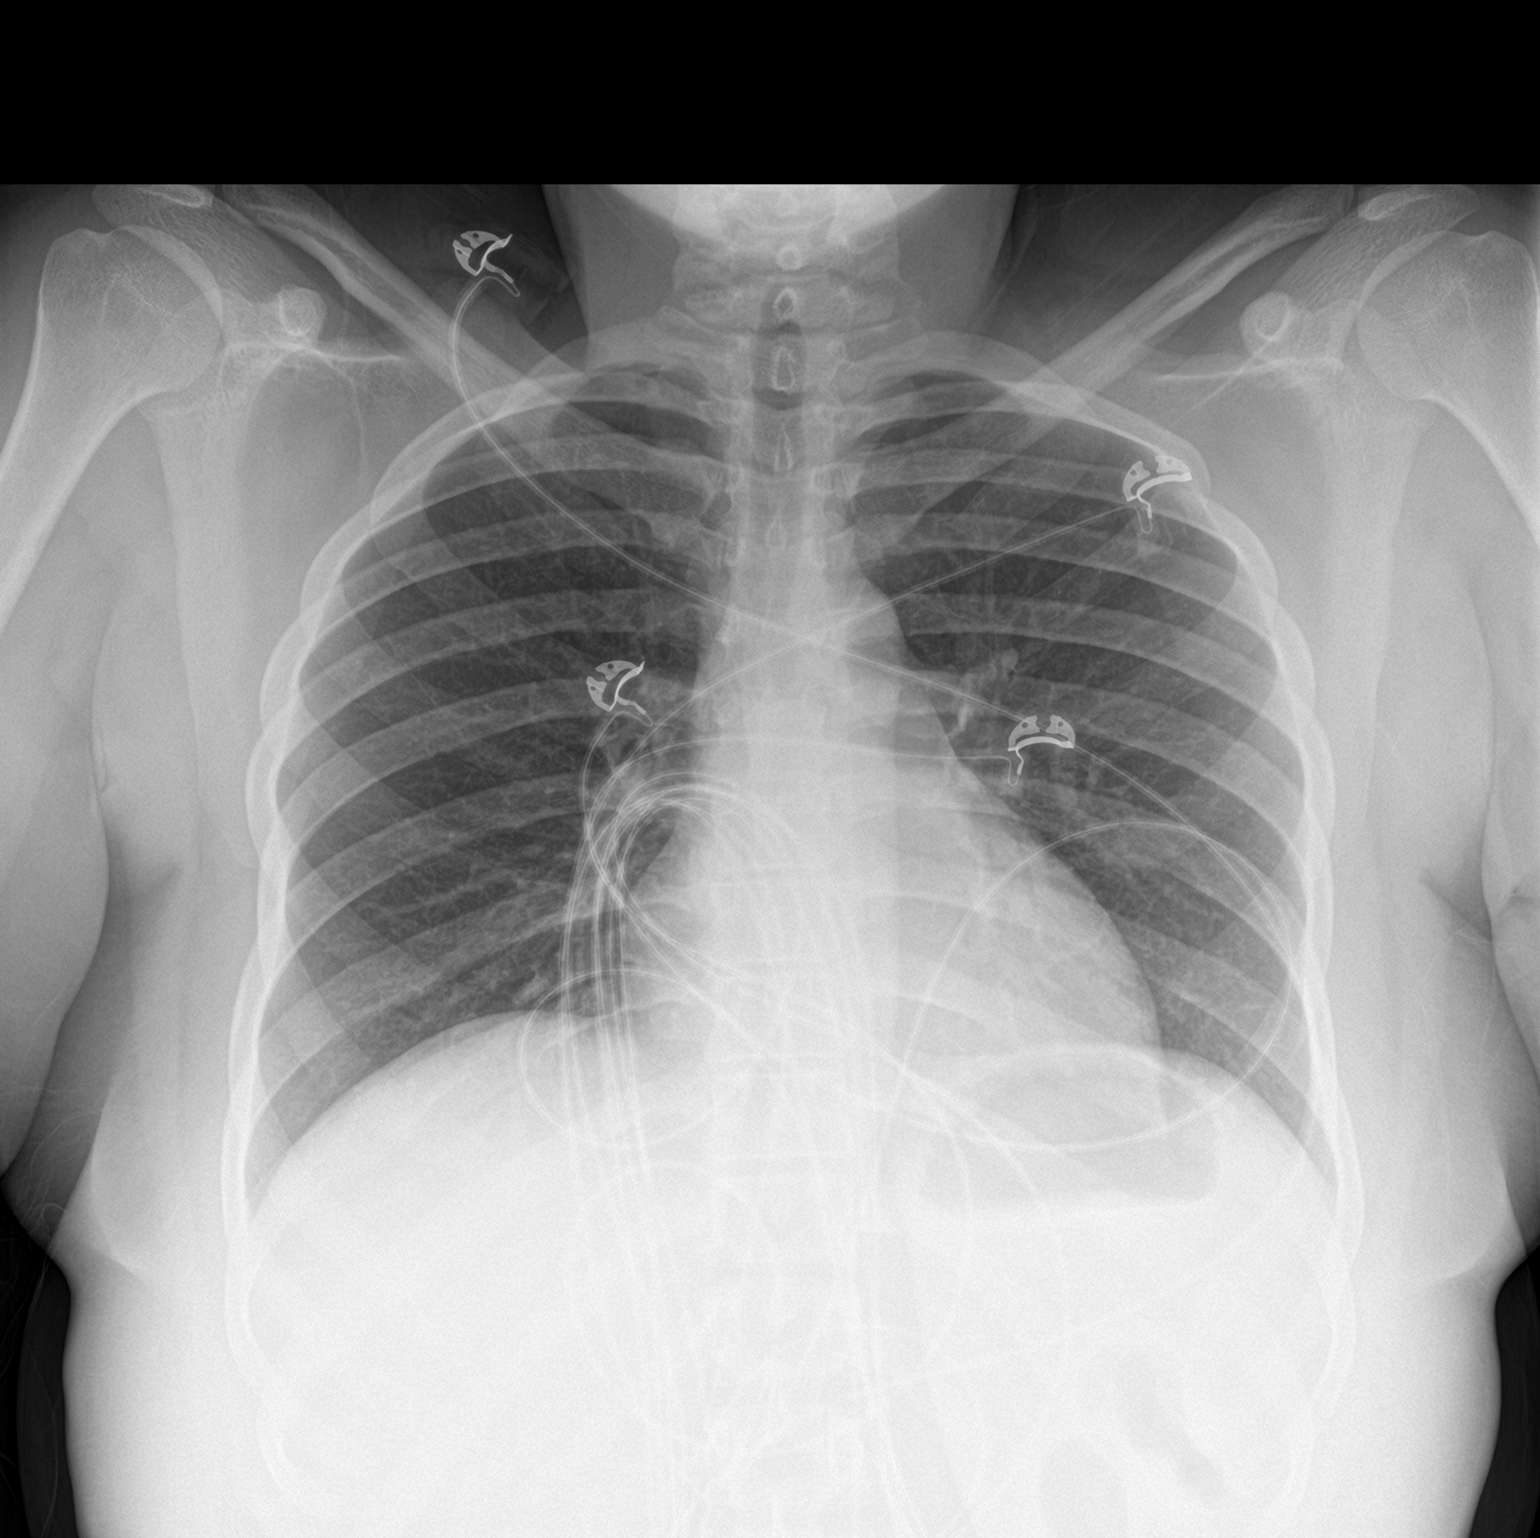

[chest lat]
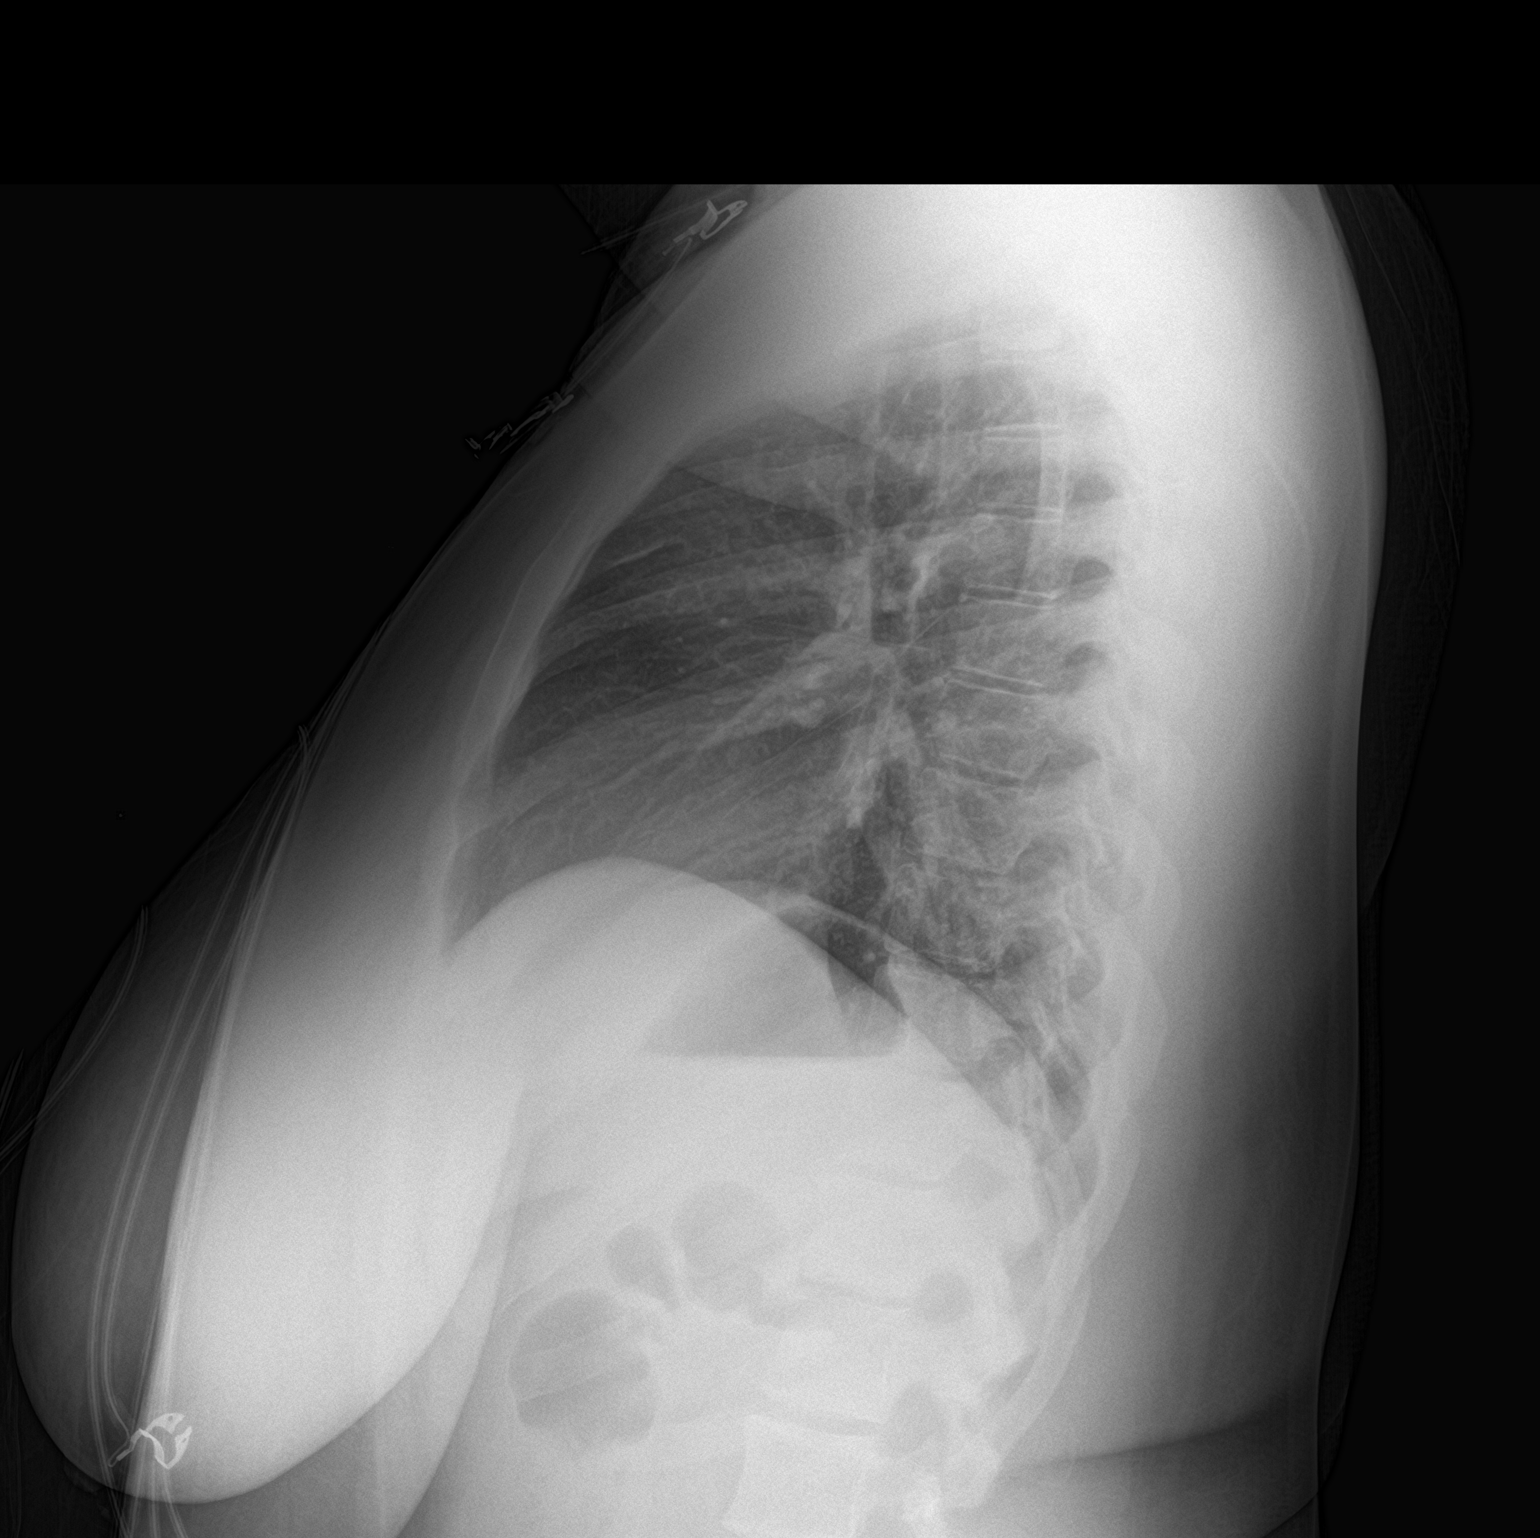

[2 of 2 positions shown; findings below may reference images not displayed]

FINDINGS: The heart size and mediastinal contours are within normal limits.
Both lungs are clear. The visualized skeletal structures are
unremarkable.
IMPRESSION: No active cardiopulmonary disease.

## 2022-06-21 ENCOUNTER — Emergency Department (HOSPITAL_BASED_OUTPATIENT_CLINIC_OR_DEPARTMENT_OTHER)
Admission: EM | Admit: 2022-06-21 | Discharge: 2022-06-21 | Disposition: A | Discharge status: Home / Self Care | Payer: No Typology Code available for payment source | Attending: Emergency Medicine | Admitting: Emergency Medicine

## 2022-06-21 ENCOUNTER — Encounter (HOSPITAL_BASED_OUTPATIENT_CLINIC_OR_DEPARTMENT_OTHER): Payer: Self-pay

## 2022-06-21 ENCOUNTER — Other Ambulatory Visit: Payer: Self-pay

## 2022-06-21 ENCOUNTER — Emergency Department (HOSPITAL_BASED_OUTPATIENT_CLINIC_OR_DEPARTMENT_OTHER): Payer: No Typology Code available for payment source

## 2022-06-21 DIAGNOSIS — R1012 Left upper quadrant pain: Secondary | ICD-10-CM | POA: Insufficient documentation

## 2022-06-21 DIAGNOSIS — R350 Frequency of micturition: Secondary | ICD-10-CM | POA: Insufficient documentation

## 2022-06-21 DIAGNOSIS — Z20822 Contact with and (suspected) exposure to covid-19: Secondary | ICD-10-CM | POA: Insufficient documentation

## 2022-06-21 DIAGNOSIS — R0602 Shortness of breath: Secondary | ICD-10-CM | POA: Diagnosis not present

## 2022-06-21 DIAGNOSIS — R000 Tachycardia, unspecified: Secondary | ICD-10-CM | POA: Insufficient documentation

## 2022-06-21 DIAGNOSIS — N390 Urinary tract infection, site not specified: Secondary | ICD-10-CM

## 2022-06-21 LAB — COMPREHENSIVE METABOLIC PANEL
ALT: 8 U/L (ref 0–44)
AST: 11 U/L — ABNORMAL LOW (ref 15–41)
Albumin: 3.6 g/dL (ref 3.5–5.0)
Alkaline Phosphatase: 43 U/L (ref 38–126)
Anion gap: 8 (ref 5–15)
BUN: <5 mg/dL — ABNORMAL LOW (ref 6–20)
CO2: 20 mmol/L — ABNORMAL LOW (ref 22–32)
Calcium: 8.6 mg/dL — ABNORMAL LOW (ref 8.9–10.3)
Chloride: 109 mmol/L (ref 98–111)
Creatinine, Ser: 0.8 mg/dL (ref 0.44–1.00)
GFR, Estimated: >60 mL/min (ref >60)
Glucose, Bld: 91 mg/dL (ref 70–99)
Potassium: 3.2 mmol/L — ABNORMAL LOW (ref 3.5–5.1)
Sodium: 137 mmol/L (ref 135–145)
Total Bilirubin: 0.8 mg/dL (ref 0.3–1.2)
Total Protein: 8.4 g/dL — ABNORMAL HIGH (ref 6.5–8.1)

## 2022-06-21 LAB — URINALYSIS, ROUTINE W REFLEX MICROSCOPIC
Bilirubin Urine: NEGATIVE
Glucose, UA: NEGATIVE mg/dL
Ketones, ur: NEGATIVE mg/dL
Nitrite: NEGATIVE
Protein, ur: 100 mg/dL — AB
Specific Gravity, Urine: 1.015 (ref 1.005–1.030)
pH: 7 (ref 5.0–8.0)

## 2022-06-21 LAB — RESP PANEL BY RT-PCR (RSV, FLU A&B, COVID)  RVPGX2
Influenza A by PCR: NEGATIVE
Influenza B by PCR: NEGATIVE
Resp Syncytial Virus by PCR: NEGATIVE
SARS Coronavirus 2 by RT PCR: NEGATIVE

## 2022-06-21 LAB — CBC
HCT: 36 % (ref 36.0–46.0)
Hemoglobin: 11.6 g/dL — ABNORMAL LOW (ref 12.0–15.0)
MCH: 26 pg (ref 26.0–34.0)
MCHC: 32.2 g/dL (ref 30.0–36.0)
MCV: 80.5 fL (ref 80.0–100.0)
Platelets: 299 10*3/uL (ref 150–400)
RBC: 4.47 MIL/uL (ref 3.87–5.11)
RDW: 13.9 % (ref 11.5–15.5)
WBC: 11 10*3/uL — ABNORMAL HIGH (ref 4.0–10.5)
nRBC: 0 % (ref 0.0–0.2)

## 2022-06-21 LAB — LIPASE, BLOOD: Lipase: 27 U/L (ref 11–51)

## 2022-06-21 LAB — URINALYSIS, MICROSCOPIC (REFLEX): WBC, UA: >50 WBC/hpf (ref 0–5)

## 2022-06-21 LAB — PREGNANCY, URINE: Preg Test, Ur: NEGATIVE

## 2022-06-21 LAB — D-DIMER, QUANTITATIVE: D-Dimer, Quant: 1.38 ug/mL-FEU — ABNORMAL HIGH (ref 0.00–0.50)

## 2022-06-21 MED ORDER — SULFAMETHOXAZOLE-TRIMETHOPRIM 800-160 MG PO TABS: 1.0000 | ORAL_TABLET | Freq: Two times a day (BID) | ORAL | 0 refills | Status: AC

## 2022-06-21 MED ORDER — IOHEXOL 350 MG/ML SOLN
75.0000 mL | Freq: Once | INTRAVENOUS | Status: AC | PRN
  Administered 2022-06-21: 75 mL via INTRAVENOUS

## 2022-06-21 MED ORDER — ACETAMINOPHEN 325 MG PO TABS
650.0000 mg | ORAL_TABLET | Freq: Once | ORAL | Status: AC | PRN
  Administered 2022-06-21: 650 mg via ORAL
  Filled 2022-06-21: qty 2

## 2022-06-21 NOTE — Discharge Instructions (Signed)
You have been evaluated for your symptoms.  Fortunately your CT scan of your chest did not show any concerning finding.  You may have a urinary tract infection therefore please take antibiotic as prescribed.  Return to ER if your symptoms worsen or if you have other concern.

## 2022-06-21 NOTE — ED Provider Notes (Signed)
Received signout from previous provider, please see his note for complete H&P.  This is a 21 year old female presenting complaining of pain to her left upper abdomen/chest ongoing for the past 2 days.  She also does endorse some shortness of breath and recent traveled.  Today her workup was remarkable for an elevated D-dimer of 1.38.  A chest CT angiogram have been ordered to rule out PE.  Her viral respiratory panel came back negative for COVID flu and RSV.  Urinalysis shows cloudy appearance with moderate hemoglobin on urine dipstick and negative pregnancy test.  Patient did report increased urinary frequency more than usual.  Last menstrual period was several weeks ago.  I have considered UTI as a source of the problem and she also has a low-grade temperature of 100.7.  Tylenol given.  BP 126/79   Pulse 70   Temp 99 F (37.2 C) (Oral)   Resp 18   Ht 5\' 4"  (1.626 m)   Wt 99.8 kg   LMP 06/08/2022   SpO2 99%   BMI 37.76 kg/m   Results for orders placed or performed during the hospital encounter of 06/21/22  Resp panel by RT-PCR (RSV, Flu A&B, Covid) Anterior Nasal Swab   Specimen: Anterior Nasal Swab  Result Value Ref Range   SARS Coronavirus 2 by RT PCR NEGATIVE NEGATIVE   Influenza A by PCR NEGATIVE NEGATIVE   Influenza B by PCR NEGATIVE NEGATIVE   Resp Syncytial Virus by PCR NEGATIVE NEGATIVE  Lipase, blood  Result Value Ref Range   Lipase 27 11 - 51 U/L  Comprehensive metabolic panel  Result Value Ref Range   Sodium 137 135 - 145 mmol/L   Potassium 3.2 (L) 3.5 - 5.1 mmol/L   Chloride 109 98 - 111 mmol/L   CO2 20 (L) 22 - 32 mmol/L   Glucose, Bld 91 70 - 99 mg/dL   BUN <5 (L) 6 - 20 mg/dL   Creatinine, Ser 06/23/22 0.44 - 1.00 mg/dL   Calcium 8.6 (L) 8.9 - 10.3 mg/dL   Total Protein 8.4 (H) 6.5 - 8.1 g/dL   Albumin 3.6 3.5 - 5.0 g/dL   AST 11 (L) 15 - 41 U/L   ALT 8 0 - 44 U/L   Alkaline Phosphatase 43 38 - 126 U/L   Total Bilirubin 0.8 0.3 - 1.2 mg/dL   GFR, Estimated 6.20  >35 mL/min   Anion gap 8 5 - 15  CBC  Result Value Ref Range   WBC 11.0 (H) 4.0 - 10.5 K/uL   RBC 4.47 3.87 - 5.11 MIL/uL   Hemoglobin 11.6 (L) 12.0 - 15.0 g/dL   HCT >59 74.1 - 63.8 %   MCV 80.5 80.0 - 100.0 fL   MCH 26.0 26.0 - 34.0 pg   MCHC 32.2 30.0 - 36.0 g/dL   RDW 45.3 64.6 - 80.3 %   Platelets 299 150 - 400 K/uL   nRBC 0.0 0.0 - 0.2 %  Urinalysis, Routine w reflex microscopic Urine, Clean Catch  Result Value Ref Range   Color, Urine YELLOW YELLOW   APPearance CLOUDY (A) CLEAR   Specific Gravity, Urine 1.015 1.005 - 1.030   pH 7.0 5.0 - 8.0   Glucose, UA NEGATIVE NEGATIVE mg/dL   Hgb urine dipstick MODERATE (A) NEGATIVE   Bilirubin Urine NEGATIVE NEGATIVE   Ketones, ur NEGATIVE NEGATIVE mg/dL   Protein, ur 21.2 (A) NEGATIVE mg/dL   Nitrite NEGATIVE NEGATIVE   Leukocytes,Ua SMALL (A) NEGATIVE  Pregnancy, urine  Result Value Ref Range   Preg Test, Ur NEGATIVE NEGATIVE  D-dimer, quantitative  Result Value Ref Range   D-Dimer, Quant 1.38 (H) 0.00 - 0.50 ug/mL-FEU  Urinalysis, Microscopic (reflex)  Result Value Ref Range   RBC / HPF 6-10 0 - 5 RBC/hpf   WBC, UA >50 0 - 5 WBC/hpf   Bacteria, UA FEW (A) NONE SEEN   Squamous Epithelial / LPF 0-5 0 - 5   CT Angio Chest PE W and/or Wo Contrast  Result Date: 06/21/2022 CLINICAL DATA:  Pulmonary embolism (PE) suspected, low to intermediate prob, positive D-dimer EXAM: CT ANGIOGRAPHY CHEST WITH CONTRAST TECHNIQUE: Multidetector CT imaging of the chest was performed using the standard protocol during bolus administration of intravenous contrast. Multiplanar CT image reconstructions and MIPs were obtained to evaluate the vascular anatomy. RADIATION DOSE REDUCTION: This exam was performed according to the departmental dose-optimization program which includes automated exposure control, adjustment of the mA and/or kV according to patient size and/or use of iterative reconstruction technique. CONTRAST:  80 mL Omnipaque 350non-ionic  IV contrast. COMPARISON:  None Available. FINDINGS: Cardiovascular: Satisfactory opacification of the pulmonary arteries to the segmental level. No evidence of pulmonary embolism. Normal heart size. No pericardial effusion. Mediastinum/Nodes: No enlarged mediastinal, hilar, or axillary lymph nodes. Thyroid gland, trachea, and esophagus demonstrate no significant findings. Lungs/Pleura: Lungs are clear. No pleural effusion or pneumothorax. Upper Abdomen: No acute abnormality. Musculoskeletal: No chest wall abnormality. No acute or significant osseous findings. Review of the MIP images confirms the above findings. IMPRESSION: No evidence of PE, aneurysm or dissection. Electronically Signed   By: Layla Maw M.D.   On: 06/21/2022 20:53   DG Chest 2 View  Result Date: 06/21/2022 CLINICAL DATA:  Shortness of breath, left upper quadrant abdominal pain EXAM: CHEST - 2 VIEW COMPARISON:  06/02/2021 FINDINGS: The heart size and mediastinal contours are within normal limits. Both lungs are clear. The visualized skeletal structures are unremarkable. IMPRESSION: No acute abnormality of the lungs. Electronically Signed   By: Jearld Lesch M.D.   On: 06/21/2022 18:45      Fayrene Helper, PA-C 06/22/22 2349    Glyn Ade, MD 06/24/22 [phone]

## 2022-06-21 NOTE — ED Provider Notes (Signed)
[facility] EMERGENCY DEPARTMENT Provider Note   CSN: [id number] Arrival date & time: 06/21/22  1541     History  Chief Complaint  Patient presents with   Abdominal Pain    Summer Thornton is a 21 y.o. female.  Patient presents to the hospital complaining of left upper quadrant abdominal pain which began approximate 2 days ago.  Patient states that pain is sharp in nature.  She denies nausea or vomiting associated with the pain.  The patient denies chest pain.  She does endorse some mild shortness of breath which seems to be worse when laying down.  The patient states that she traveled to [place] 2 weeks ago.  Patient has history of abortion in November of this year.  Patient does also complain of urinary frequency.  No relevant past medical history on file  HPI     Home Medications Prior to Admission medications   Medication Sig Start Date End Date Taking? Authorizing Provider  albuterol (PROVENTIL HFA;VENTOLIN HFA) 108 (90 BASE) MCG/ACT inhaler Inhale 2 puffs into the lungs every 6 (six) hours as needed.   Patient not taking: Reported on 12/22/2019    [provider]  APRI 0.15-30 MG-MCG tablet Take 1 tablet by mouth daily. 12/17/19   [provider]  lidocaine (XYLOCAINE) 2 % solution Use as directed 15 mLs in the mouth or throat as needed for mouth pain. 02/09/22   Achille Rich, PA-C  metroNIDAZOLE (FLAGYL) 500 MG tablet Take 1 tablet (500 mg total) by mouth 2 (two) times daily. 02/09/22   Achille Rich, PA-C  montelukast (SINGULAIR) 5 MG chewable tablet Chew 5 mg by mouth at bedtime.   Patient not taking: Reported on 12/22/2019    [provider]  ondansetron (ZOFRAN-ODT) 8 MG disintegrating tablet 8mg  ODT q4 hours prn nausea 06/02/21   14/3/22, MD  predniSONE (DELTASONE) 10 MG tablet Take 2 tablets (20 mg total) by mouth 2 (two) times daily with a meal. 06/02/21   14/3/22, MD  Vitamin D, Ergocalciferol, (DRISDOL) 1.25 MG (50000 UNIT) CAPS  capsule Take 50,000 Units by mouth once a week. 11/09/19   [provider]      Allergies    Patient has no known allergies.    Review of Systems   Review of Systems  Constitutional:  Negative for chills and fever.  Respiratory:  Positive for shortness of breath. Negative for cough.   Cardiovascular:  Negative for chest pain and palpitations.  Gastrointestinal:  Positive for abdominal pain. Negative for constipation, diarrhea, nausea and vomiting.  Genitourinary:  Positive for frequency and urgency. Negative for dysuria.  Neurological:  Negative for headaches.    Physical Exam Updated Vital Signs BP 107/78   Pulse 91   Temp (!) 100.7 F (38.2 C) (Oral)   Resp 16   Ht 5\' 4"  (1.626 m)   Wt 99.8 kg   LMP 06/08/2022   SpO2 99%   BMI 37.76 kg/m  Physical Exam Vitals and nursing note reviewed.  Constitutional:      General: She is not in acute distress.    Appearance: She is well-developed.  HENT:     Head: Normocephalic and atraumatic.     Mouth/Throat:     Mouth: Mucous membranes are moist.  Eyes:     General: No scleral icterus.    Conjunctiva/sclera: Conjunctivae normal.     Pupils: Pupils are equal, round, and reactive to light.  Cardiovascular:     Rate and Rhythm: Regular  rhythm. Tachycardia present.     Heart sounds: No murmur heard. Pulmonary:     Effort: Pulmonary effort is normal. No respiratory distress.     Breath sounds: Normal breath sounds.  Abdominal:     Palpations: Abdomen is soft.     Tenderness: There is abdominal tenderness (Mild LUQ tenderness) in the left upper quadrant. There is no right CVA tenderness or left CVA tenderness.  Musculoskeletal:        General: No swelling.     Cervical back: Neck supple.  Skin:    General: Skin is warm and dry.     Capillary Refill: Capillary refill takes less than 2 seconds.  Neurological:     Mental Status: She is alert.  Psychiatric:        Mood and Affect: Mood normal.     ED Results /  Procedures / Treatments   Labs (all labs ordered are listed, but only abnormal results are displayed) Labs Reviewed  CBC - Abnormal; Notable for the following components:      Result Value   WBC 11.0 (*)    Hemoglobin 11.6 (*)    All other components within normal limits  URINALYSIS, ROUTINE W REFLEX MICROSCOPIC - Abnormal; Notable for the following components:   APPearance CLOUDY (*)    Hgb urine dipstick MODERATE (*)    Protein, ur 100 (*)    Leukocytes,Ua SMALL (*)    All other components within normal limits  URINALYSIS, MICROSCOPIC (REFLEX) - Abnormal; Notable for the following components:   Bacteria, UA FEW (*)    All other components within normal limits  RESP PANEL BY RT-PCR (RSV, FLU A&B, COVID)  [id number]  PREGNANCY, URINE  LIPASE, BLOOD  COMPREHENSIVE METABOLIC PANEL  D-DIMER, QUANTITATIVE    EKG None  Radiology No results found.  Procedures Procedures    Medications Ordered in ED Medications  acetaminophen (TYLENOL) tablet 650 mg (650 mg Oral Given 06/21/22 1701)    ED Course/ Medical Decision Making/ A&P                           Medical Decision Making Amount and/or Complexity of Data Reviewed Labs: ordered. Radiology: ordered.  Risk OTC drugs.   This patient presents to the ED for concern of abdominal pain, this involves an extensive number of treatment options, and is a complaint that carries with it a high risk of complications and morbidity.  The differential diagnosis includes pneumonia, cholecystitis, diverticulitis, appendicitis, pancreatitis, nephrolithiasis, PE, and others   Co morbidities that complicate the patient evaluation  None   Lab Tests:  I Ordered, and personally interpreted labs.  The pertinent results include: Negative respiratory panel, negative urine pregnancy test, UA with small leukocytes and few bacteria, not indicative of infection   Imaging Studies ordered:  I ordered imaging studies including chest  x-ray   Problem List / ED Course / Critical interventions / Medication management  I ordered medication including tylenol for fever  Reevaluation of the patient after these medicines showed that the patient improved I have reviewed the patients home medicines and have made adjustments as needed   Test / Admission - Considered:  Patient care being transferred to Fayrene Helper, PA-C at shift handoff.  Disposition pending lab results.  Patient has no significant abdominal tenderness to suggest appendicitis or cholecystitis at this time.  Patient denies any bloody bowel movements or diarrhea, low clinical suspicion of diverticulitis.  Symptoms or not aggravated  or alleviated by eating.  Patient does have some mild shortness of breath and is tachycardic.  The tachycardia is likely due to the patient's underlying fever but unable to rule out PE at this time.  D-dimer pending for further evaluation.        Final Clinical Impression(s) / ED Diagnoses Final diagnoses:  Left upper quadrant abdominal pain    Rx / DC Orders ED Discharge Orders     None         Pamala Duffel 06/21/22 1847    Glyn Ade, MD 06/24/22 [phone]

## 2022-06-21 NOTE — ED Triage Notes (Signed)
Pt c/o LUQ abd pain that started yesterday. Pt reports the pain makes it difficulty to take a deep breath. Pt denies N/V/D.

## 2022-06-21 NOTE — ED Notes (Signed)
Patient transported to CT 

## 2022-06-21 NOTE — ED Notes (Signed)
Dc instructions and scripts reviewed with pt no questions or concerns at this time will follow up with pcp if needed.  

## 2023-02-04 ENCOUNTER — Other Ambulatory Visit: Payer: Self-pay

## 2023-02-04 ENCOUNTER — Encounter (HOSPITAL_BASED_OUTPATIENT_CLINIC_OR_DEPARTMENT_OTHER): Payer: Self-pay | Admitting: Emergency Medicine

## 2023-02-04 ENCOUNTER — Emergency Department (HOSPITAL_BASED_OUTPATIENT_CLINIC_OR_DEPARTMENT_OTHER): Payer: No Typology Code available for payment source

## 2023-02-04 ENCOUNTER — Emergency Department (HOSPITAL_BASED_OUTPATIENT_CLINIC_OR_DEPARTMENT_OTHER)
Admission: EM | Admit: 2023-02-04 | Discharge: 2023-02-04 | Disposition: A | Discharge status: Home / Self Care | Payer: No Typology Code available for payment source | Attending: Emergency Medicine | Admitting: Emergency Medicine

## 2023-02-04 DIAGNOSIS — R072 Precordial pain: Secondary | ICD-10-CM | POA: Diagnosis not present

## 2023-02-04 DIAGNOSIS — R079 Chest pain, unspecified: Secondary | ICD-10-CM | POA: Diagnosis present

## 2023-02-04 LAB — CBC
HCT: 38.2 % (ref 36.0–46.0)
Hemoglobin: 12.3 g/dL (ref 12.0–15.0)
MCH: 26.4 pg (ref 26.0–34.0)
MCHC: 32.2 g/dL (ref 30.0–36.0)
MCV: 82 fL (ref 80.0–100.0)
Platelets: 342 10*3/uL (ref 150–400)
RBC: 4.66 MIL/uL (ref 3.87–5.11)
RDW: 13.6 % (ref 11.5–15.5)
WBC: 7.4 10*3/uL (ref 4.0–10.5)
nRBC: 0 % (ref 0.0–0.2)

## 2023-02-04 LAB — TROPONIN I (HIGH SENSITIVITY): Troponin I (High Sensitivity): <2 ng/L (ref <18)

## 2023-02-04 LAB — BASIC METABOLIC PANEL
Anion gap: 7 (ref 5–15)
BUN: 18 mg/dL (ref 6–20)
CO2: 22 mmol/L (ref 22–32)
Calcium: 8.8 mg/dL — ABNORMAL LOW (ref 8.9–10.3)
Chloride: 109 mmol/L (ref 98–111)
Creatinine, Ser: 0.82 mg/dL (ref 0.44–1.00)
GFR, Estimated: >60 mL/min (ref >60)
Glucose, Bld: 104 mg/dL — ABNORMAL HIGH (ref 70–99)
Potassium: 3.5 mmol/L (ref 3.5–5.1)
Sodium: 138 mmol/L (ref 135–145)

## 2023-02-04 LAB — PREGNANCY, URINE: Preg Test, Ur: NEGATIVE

## 2023-02-04 NOTE — Discharge Instructions (Signed)
You were seen in the emergency department today for chest pain.  As we discussed your lab work, EKG, chest x-ray all looked reassuring today.  I think that your symptoms could likely been related to adverse response to marijuana and alcohol.   I recommend monitoring your stress levels.  Continue to monitor how you are doing overall, and return to the emergency department for any new or worsening symptoms such as: Worsening pain or pain with exertion, difficulty breathing, sweating, or pain or swelling in your legs.

## 2023-02-04 NOTE — ED Triage Notes (Signed)
Pt POV c/o chest pain and palpitations since last night after smoking marijuana. Reports similar instances in the past, but this is more painful.   Also reports full body shaking during episode.   Denies cp at this time. Denies ShOB.

## 2023-02-04 NOTE — ED Provider Notes (Signed)
[place] [facility]ERGENCY DEPARTMENT AT [facility] Provider Note   CSN: [id number] Arrival date & time: 02/04/23  1638     History  Chief Complaint  Patient presents with   Chest Pain    Summer Thornton is a 22 y.o. female.  Patient with noncontributory medical history presents today with complaints of chest pain. States that same occurred yesterday evening after smoking marijuana and drinking alcohol. She notes that she had 1 episode of pain throughout her chest that was with associated 'shaking' and lasted an hour and then resolved and has not returned. Since then she has felt back to normal. Denies any shortness of breath, leg pain, or leg swelling.   The history is provided by the patient. No language interpreter was used.  Chest Pain      Home Medications Prior to Admission medications   Medication Sig Start Date End Date Taking? Authorizing Provider  albuterol (PROVENTIL HFA;VENTOLIN HFA) 108 (90 BASE) MCG/ACT inhaler Inhale 2 puffs into the lungs every 6 (six) hours as needed.   Patient not taking: Reported on 12/22/2019    [provider]  APRI 0.15-30 MG-MCG tablet Take 1 tablet by mouth daily. 12/17/19   [provider]  lidocaine (XYLOCAINE) 2 % solution Use as directed 15 mLs in the mouth or throat as needed for mouth pain. 02/09/22   Achille Rich, PA-C  metroNIDAZOLE (FLAGYL) 500 MG tablet Take 1 tablet (500 mg total) by mouth 2 (two) times daily. 02/09/22   Achille Rich, PA-C  montelukast (SINGULAIR) 5 MG chewable tablet Chew 5 mg by mouth at bedtime.   Patient not taking: Reported on 12/22/2019    [provider]  ondansetron (ZOFRAN-ODT) 8 MG disintegrating tablet 8mg  ODT q4 hours prn nausea 06/02/21   Geoffery Lyons, MD  predniSONE (DELTASONE) 10 MG tablet Take 2 tablets (20 mg total) by mouth 2 (two) times daily with a meal. 06/02/21   Geoffery Lyons, MD  Vitamin D, Ergocalciferol, (DRISDOL) 1.25 MG (50000 UNIT) CAPS capsule Take 50,000  Units by mouth once a week. 11/09/19   [provider]      Allergies    Patient has no known allergies.    Review of Systems   Review of Systems  Cardiovascular:  Positive for chest pain (none currently).  All other systems reviewed and are negative.   Physical Exam Updated Vital Signs BP (!) 97/57   Pulse 75   Temp 98.3 F (36.8 C)   Resp (!) 21   Ht 5\' 5"  (1.651 m)   Wt 95.3 kg   LMP 01/30/2023 (Approximate)   SpO2 99%   BMI 34.95 kg/m  Physical Exam Vitals and nursing note reviewed.  Constitutional:      General: She is not in acute distress.    Appearance: Normal appearance. She is normal weight. She is not ill-appearing, toxic-appearing or diaphoretic.  HENT:     Head: Normocephalic and atraumatic.  Cardiovascular:     Rate and Rhythm: Normal rate and regular rhythm.     Pulses:          Dorsalis pedis pulses are 2+ on the right side and 2+ on the left side.       Posterior tibial pulses are 2+ on the right side and 2+ on the left side.     Heart sounds: Normal heart sounds.  Pulmonary:     Effort: Pulmonary effort is normal. No respiratory distress.     Breath sounds: Normal breath sounds.  Chest:     Chest wall: No tenderness.  Abdominal:     Palpations: Abdomen is soft.     Tenderness: There is no abdominal tenderness.  Musculoskeletal:        General: Normal range of motion.     Cervical back: Normal range of motion.     Right lower leg: No tenderness. No edema.     Left lower leg: No tenderness. No edema.  Skin:    General: Skin is warm and dry.  Neurological:     General: No focal deficit present.     Mental Status: She is alert.  Psychiatric:        Mood and Affect: Mood normal.        Behavior: Behavior normal.     ED Results / Procedures / Treatments   Labs (all labs ordered are listed, but only abnormal results are displayed) Labs Reviewed  BASIC METABOLIC PANEL - Abnormal; Notable for the following components:      Result  Value   Glucose, Bld 104 (*)    Calcium 8.8 (*)    All other components within normal limits  CBC  PREGNANCY, URINE  TROPONIN I (HIGH SENSITIVITY)    EKG None  Radiology DG Chest 2 View  Result Date: 02/04/2023 CLINICAL DATA:  Chest pain on the left side of the began last night EXAM: CHEST - 2 VIEW COMPARISON:  X-ray 06/21/2022 and CT angiogram.  Older exams as well FINDINGS: The heart size and mediastinal contours are within normal limits. Both lungs are clear. No consolidation, pneumothorax or effusion. No edema. Air-fluid level along the stomach beneath the left hemidiaphragm. The visualized skeletal structures are unremarkable. IMPRESSION: No acute cardiopulmonary disease. Electronically Signed   By: Karen Kays M.D.   On: 02/04/2023 17:17    Procedures Procedures    Medications Ordered in ED Medications - No data to display  ED Course/ Medical Decision Making/ A&P                                 Medical Decision Making Amount and/or Complexity of Data Reviewed Labs: ordered. Radiology: ordered.   This patient is a 22 y.o. female who presents to the ED for concern of chest pain, this involves an extensive number of treatment options, and is a complaint that carries with it a high risk of complications and morbidity. The emergent differential diagnosis prior to evaluation includes, but is not limited to,  ACS, pericarditis, myocarditis, aortic dissection, PE, pneumothorax, esophageal spasm or rupture, chronic angina, pneumonia, bronchitis, GERD, reflux/PUD, biliary disease, pancreatitis, costochondritis, anxiety   This is not an exhaustive differential.   Past Medical History / Co-morbidities / Social History: N/A  Additional history: Chart reviewed. Pertinent results include: Ongoing symptoms in 2022 had a PE study that was normal as well as an unremarkable workup  Physical Exam: Physical exam performed. The pertinent findings include: Per above, no abnormal physical  exam findings  Lab Tests: I ordered, and personally interpreted labs.  The pertinent results include:  Trop <2, no other acute laboratory abnormalities   Imaging Studies: I ordered imaging studies including CXR. I independently visualized and interpreted imaging which showed NAD. I agree with the radiologist interpretation.   Cardiac Monitoring:  The patient was maintained on a cardiac monitor.  Cardiac monitor showed an underlying rhythm of: sinus rhythm. I agree with this interpretation.   Disposition: After consideration of the  diagnostic results and the patients response to treatment, I feel that emergency department workup does not suggest an emergent condition requiring admission or immediate intervention beyond what has been performed at this time. The plan is: Discharge with close monitoring and outpatient follow-up.  Patient's symptoms occurred yesterday and resolved without intervention.  Patient is afebrile, nontoxic-appearing, in no acute distress reassuring vital signs and no symptoms.  She is PERC negative.  Workup benign. Symptoms occurred after alcohol and marijuana use. Likely cause of symptoms.  Discussed with patient who is understanding and in agreement with this.  Evaluation and diagnostic testing in the emergency department does not suggest an emergent condition requiring admission or immediate intervention beyond what has been performed at this time.  Plan for discharge with close PCP follow-up.  Patient is understanding and amenable with plan, educated on red flag symptoms that would prompt immediate return.  Patient discharged in stable condition.   Final Clinical Impression(s) / ED Diagnoses Final diagnoses:  Precordial chest pain    Rx / DC Orders ED Discharge Orders     None     An After Visit Summary was printed and given to the patient.     Silva Bandy, PA-C 02/04/23 1913    Sloan Leiter, DO 02/04/23 2354

## 2023-05-24 ENCOUNTER — Other Ambulatory Visit: Payer: Self-pay

## 2023-05-24 ENCOUNTER — Emergency Department (HOSPITAL_BASED_OUTPATIENT_CLINIC_OR_DEPARTMENT_OTHER)
Admission: EM | Admit: 2023-05-24 | Discharge: 2023-05-24 | Disposition: A | Discharge status: Home / Self Care | Payer: No Typology Code available for payment source | Attending: Emergency Medicine | Admitting: Emergency Medicine

## 2023-05-24 ENCOUNTER — Encounter (HOSPITAL_BASED_OUTPATIENT_CLINIC_OR_DEPARTMENT_OTHER): Payer: Self-pay

## 2023-05-24 ENCOUNTER — Emergency Department (HOSPITAL_BASED_OUTPATIENT_CLINIC_OR_DEPARTMENT_OTHER): Payer: No Typology Code available for payment source

## 2023-05-24 DIAGNOSIS — Z3A12 12 weeks gestation of pregnancy: Secondary | ICD-10-CM | POA: Insufficient documentation

## 2023-05-24 DIAGNOSIS — R8271 Bacteriuria: Secondary | ICD-10-CM | POA: Insufficient documentation

## 2023-05-24 DIAGNOSIS — O468X1 Other antepartum hemorrhage, first trimester: Secondary | ICD-10-CM | POA: Insufficient documentation

## 2023-05-24 DIAGNOSIS — O2391 Unspecified genitourinary tract infection in pregnancy, first trimester: Secondary | ICD-10-CM | POA: Diagnosis not present

## 2023-05-24 DIAGNOSIS — O4690 Antepartum hemorrhage, unspecified, unspecified trimester: Secondary | ICD-10-CM

## 2023-05-24 DIAGNOSIS — O208 Other hemorrhage in early pregnancy: Secondary | ICD-10-CM

## 2023-05-24 LAB — COMPREHENSIVE METABOLIC PANEL
ALT: 12 U/L (ref 0–44)
AST: 16 U/L (ref 15–41)
Albumin: 3.5 g/dL (ref 3.5–5.0)
Alkaline Phosphatase: 49 U/L (ref 38–126)
Anion gap: 7 (ref 5–15)
BUN: 7 mg/dL (ref 6–20)
CO2: 23 mmol/L (ref 22–32)
Calcium: 8.8 mg/dL — ABNORMAL LOW (ref 8.9–10.3)
Chloride: 105 mmol/L (ref 98–111)
Creatinine, Ser: 0.59 mg/dL (ref 0.44–1.00)
GFR, Estimated: >60 mL/min (ref >60)
Glucose, Bld: 97 mg/dL (ref 70–99)
Potassium: 3.3 mmol/L — ABNORMAL LOW (ref 3.5–5.1)
Sodium: 135 mmol/L (ref 135–145)
Total Bilirubin: 0.3 mg/dL (ref <1.2)
Total Protein: 7.8 g/dL (ref 6.5–8.1)

## 2023-05-24 LAB — WET PREP, GENITAL
Clue Cells Wet Prep HPF POC: NONE SEEN
Sperm: NONE SEEN
Trich, Wet Prep: NONE SEEN
WBC, Wet Prep HPF POC: <10 (ref <10)
Yeast Wet Prep HPF POC: NONE SEEN

## 2023-05-24 LAB — RH IG WORKUP (INCLUDES ABO/RH)
ABO/RH(D): AB POS
Antibody Screen: NEGATIVE
Gestational Age(Wks): 8

## 2023-05-24 LAB — CBC
HCT: 35.7 % — ABNORMAL LOW (ref 36.0–46.0)
Hemoglobin: 11.9 g/dL — ABNORMAL LOW (ref 12.0–15.0)
MCH: 26.2 pg (ref 26.0–34.0)
MCHC: 33.3 g/dL (ref 30.0–36.0)
MCV: 78.6 fL — ABNORMAL LOW (ref 80.0–100.0)
Platelets: 255 10*3/uL (ref 150–400)
RBC: 4.54 MIL/uL (ref 3.87–5.11)
RDW: 13.2 % (ref 11.5–15.5)
WBC: 7.5 10*3/uL (ref 4.0–10.5)
nRBC: 0 % (ref 0.0–0.2)

## 2023-05-24 LAB — URINALYSIS, ROUTINE W REFLEX MICROSCOPIC
Bilirubin Urine: NEGATIVE
Glucose, UA: NEGATIVE mg/dL
Ketones, ur: NEGATIVE mg/dL
Leukocytes,Ua: NEGATIVE
Nitrite: NEGATIVE
Protein, ur: NEGATIVE mg/dL
Specific Gravity, Urine: >=1.03 (ref 1.005–1.030)
pH: 6.5 (ref 5.0–8.0)

## 2023-05-24 LAB — URINALYSIS, MICROSCOPIC (REFLEX)

## 2023-05-24 LAB — LIPASE, BLOOD: Lipase: 23 U/L (ref 11–51)

## 2023-05-24 LAB — HCG, QUANTITATIVE, PREGNANCY: hCG, Beta Chain, Quant, S: 109822 m[IU]/mL — ABNORMAL HIGH (ref <5)

## 2023-05-24 LAB — PREGNANCY, URINE: Preg Test, Ur: POSITIVE — AB

## 2023-05-24 MED ORDER — PRENATAL COMPLETE 14-0.4 MG PO TABS: 1.0000 | ORAL_TABLET | Freq: Every day | ORAL | 1 refills | Status: AC

## 2023-05-24 MED ORDER — CEPHALEXIN 500 MG PO CAPS: 500.0000 mg | ORAL_CAPSULE | Freq: Three times a day (TID) | ORAL | 0 refills | Status: AC

## 2023-05-24 NOTE — Discharge Instructions (Signed)
It was a pleasure taking care of you here in the emergency department  You need to stay off your feet is much as possible, pelvic rest meaning nothing in the vagina until following up with OB/GYN  No exertional activity.  You do have bacteria in your urine we have started on antibiotic and sent your urine for culture  If you are not already on a prenatal vitamin I have sent one in.

## 2023-05-24 NOTE — ED Provider Notes (Addendum)
[facility] Provider Note   CSN: [id number] Arrival date & time: 05/24/23  1431    History  Chief Complaint  Patient presents with   Abdominal Pain   Vaginal Bleeding    Summer Thornton is a 22 y.o. female G3P0 approx 12 weeks by date, LMP 03/13/23 approx [redacted]w[redacted]d here for evaluation of intermittent lower abdominal cramping, started 1/2 weeks ago.  Comes and goes.  Not worse or better with any specific events.  Has also noted some vaginal bleeding.  Previously was dark brown now today was brighter red.  No clots.  She has no current abdominal pain.  When she does have pain she describes it as an aching, pulling sensation.  She tried to drink more fluids as she thought this was cramping due to being dehydrated.  No fever, emesis, back pain, dysuria, hematuria.  Has noted a vaginal odor without any discharge.  She denies any concern for any STI.  Last bowel movement was yesterday.  Stool is "looser" than normal.  No blood in stool. ObGYN- has not established care. Taking prenatal.   HPI     Home Medications Prior to Admission medications   Medication Sig Start Date End Date Taking? Authorizing Provider  cephALEXin (KEFLEX) 500 MG capsule Take 1 capsule (500 mg total) by mouth 3 (three) times daily for 5 days. 05/24/23 05/29/23 Yes Mikenzie Mccannon A, PA-C  Prenatal Vit-Fe Fumarate-FA (PRENATAL COMPLETE) 14-0.4 MG TABS Take 1 tablet by mouth daily. 05/24/23  Yes Giavanni Odonovan A, PA-C  APRI 0.15-30 MG-MCG tablet Take 1 tablet by mouth daily. 12/17/19   [provider]  lidocaine (XYLOCAINE) 2 % solution Use as directed 15 mLs in the mouth or throat as needed for mouth pain. 02/09/22   Achille Rich, PA-C  metroNIDAZOLE (FLAGYL) 500 MG tablet Take 1 tablet (500 mg total) by mouth 2 (two) times daily. 02/09/22   Achille Rich, PA-C  ondansetron (ZOFRAN-ODT) 8 MG disintegrating tablet 8mg  ODT q4 hours prn nausea 06/02/21   Geoffery Lyons, MD   predniSONE (DELTASONE) 10 MG tablet Take 2 tablets (20 mg total) by mouth 2 (two) times daily with a meal. 06/02/21   Geoffery Lyons, MD  Vitamin D, Ergocalciferol, (DRISDOL) 1.25 MG (50000 UNIT) CAPS capsule Take 50,000 Units by mouth once a week. 11/09/19   [provider]      Allergies    Patient has no known allergies.    Review of Systems   Review of Systems  Constitutional: Negative.   HENT: Negative.    Respiratory: Negative.    Gastrointestinal:  Positive for abdominal pain, diarrhea (1 episode) and nausea. Negative for abdominal distention, anal bleeding, blood in stool, constipation, rectal pain and vomiting.  Genitourinary: Negative.   Musculoskeletal: Negative.   Skin: Negative.   Neurological: Negative.   All other systems reviewed and are negative.   Physical Exam Updated Vital Signs BP 110/65   Pulse 62   Temp 98.9 F (37.2 C) (Oral)   Resp 16   Ht 5\' 5"  (1.651 m)   Wt 95 kg   LMP 03/11/2023 (Exact Date)   SpO2 98%   BMI 34.85 kg/m  Physical Exam Vitals and nursing note reviewed.  Constitutional:      General: She is not in acute distress.    Appearance: She is well-developed. She is not ill-appearing, toxic-appearing or diaphoretic.  HENT:     Head: Atraumatic.  Eyes:     Pupils: Pupils are  equal, round, and reactive to light.  Cardiovascular:     Rate and Rhythm: Normal rate.     Heart sounds: Normal heart sounds.  Pulmonary:     Effort: Pulmonary effort is normal. No respiratory distress.     Breath sounds: Normal breath sounds.  Abdominal:     General: Bowel sounds are normal. There is no distension.     Palpations: Abdomen is soft.     Tenderness: There is no abdominal tenderness. There is no right CVA tenderness, left CVA tenderness, guarding or rebound. Negative signs include Murphy's sign and McBurney's sign.     Comments: Soft non tender  Genitourinary:    Comments: Declined exam Musculoskeletal:        General: Normal range of  motion.     Cervical back: Normal range of motion.  Skin:    General: Skin is warm and dry.  Neurological:     General: No focal deficit present.     Mental Status: She is alert.  Psychiatric:        Mood and Affect: Mood normal.    ED Results / Procedures / Treatments   Labs (all labs ordered are listed, but only abnormal results are displayed) Labs Reviewed  COMPREHENSIVE METABOLIC PANEL - Abnormal; Notable for the following components:      Result Value   Potassium 3.3 (*)    Calcium 8.8 (*)    All other components within normal limits  CBC - Abnormal; Notable for the following components:   Hemoglobin 11.9 (*)    HCT 35.7 (*)    MCV 78.6 (*)    All other components within normal limits  URINALYSIS, ROUTINE W REFLEX MICROSCOPIC - Abnormal; Notable for the following components:   Hgb urine dipstick TRACE (*)    All other components within normal limits  PREGNANCY, URINE - Abnormal; Notable for the following components:   Preg Test, Ur POSITIVE (*)    All other components within normal limits  HCG, QUANTITATIVE, PREGNANCY - Abnormal; Notable for the following components:   hCG, Beta Chain, [place] Longest 161,096 (*)    All other components within normal limits  URINALYSIS, MICROSCOPIC (REFLEX) - Abnormal; Notable for the following components:   Bacteria, UA MANY (*)    All other components within normal limits  WET PREP, GENITAL  URINE CULTURE  LIPASE, BLOOD  RH IG WORKUP (INCLUDES ABO/RH)  GC/CHLAMYDIA PROBE AMP ([place]) NOT AT [facility]    EKG None  Radiology US OB LESS THAN 14 WEEKS WITH OB TRANSVAGINAL  Result Date: 05/24/2023 CLINICAL DATA:  Pelvic cramping and spotting. Positive pregnancy test. EXAM: OBSTETRIC <14 WK [place] AND TRANSVAGINAL OB US TECHNIQUE: Both transabdominal and transvaginal ultrasound examinations were performed for complete evaluation of the gestation as well as the maternal uterus, adnexal regions, and pelvic cul-de-sac. Transvaginal technique was  performed to assess early pregnancy. COMPARISON:  None Available. FINDINGS: Intrauterine gestational sac: Single Yolk sac:  Visualized Embryo:  Visualized Cardiac Activity: Visualized Heart Rate: 176 bpm CRL:  37.1 mm   10 w   4 d                  [place] EDC: 12/16/2023 Subchorionic hemorrhage:  Small to moderate Maternal uterus/adnexae: Corpus luteum cyst noted left ovary. IMPRESSION: Single living intrauterine gestation at estimated 10 week 4 day gestational age by crown-rump length. Small to moderate subchorionic hemorrhage. Electronically Signed   By: Kennith Center M.D.   On: 05/24/2023 17:07  Procedures Procedures    Medications Ordered in ED Medications - No data to display  ED Course/ Medical Decision Making/ A&P   22 year old here for evaluation of lower abdominal cramping that waxes and wanes.  Also noted recent positive home pregnancy test, she thinks she is approximately 2 months pregnant.  Some nausea without vomiting.  No urinary symptoms.  No chest pain, shortness of breath.  Yesterday had an episode of loose stool, only occurred once, no blood.  Nursing antibiotics or travel.  She has no current abdominal pain.  No prior ultrasound. G3P0.  Has not establish care with OB/GYN, prenatal taking.  No labs, imaging and reassess she declines pelvic exam.  Labs and imaging personally viewed and interpreted:  CBC without leukocytosis, hemoglobin 10.9 Pregnancy test positive UA shows many bacteria, nitrite, leuks negative Lipase 23 Metabolic panel potassium 3.3 Wet prep neg-- patient elected to self swab, declined GU exam RH-- AB pos, does not need RhoGAM  US shows 10-week 4-day pregnancy with small to moderate subchorionic hemorrhage.  Discussed results with patient.  Discussed limiting activity, close up with OB/GYN, return for any worsening symptoms. UA sent for culture, given many bacteria will treat with Keflex.  Patient is nontoxic, nonseptic appearing, in no apparent distress.   Patient's pain and other symptoms adequately managed in emergency department.  Fluid bolus given.  Labs, imaging and vitals reviewed.  Patient does not meet the SIRS or Sepsis criteria.  On repeat exam patient does not have a surgical abdomin and there are no peritoneal signs.  No indication of appendicitis, bowel obstruction, bowel perforation, cholecystitis, diverticulitis, PID, TOA, intermittent/persistent torsion or ectopic pregnancy.  Patient discharged home with symptomatic treatment and given strict instructions for follow-up with their primary care physician.  I have also discussed reasons to return immediately to the ER.  Patient expresses understanding and agrees with plan.                                  Medical Decision Making Amount and/or Complexity of Data Reviewed External Data Reviewed: labs, radiology and notes. Labs: ordered. Decision-making details documented in ED Course. Radiology: ordered and independent interpretation performed. Decision-making details documented in ED Course.  Risk OTC drugs. Prescription drug management. Diagnosis or treatment significantly limited by social determinants of health.     Final Clinical Impression(s) / ED Diagnoses Final diagnoses:  Vaginal bleeding in pregnancy  Subchorionic hemorrhage of placenta in first trimester  Bacteriuria during pregnancy    Rx / DC Orders ED Discharge Orders          Ordered    Prenatal Vit-Fe Fumarate-FA (PRENATAL COMPLETE) 14-0.4 MG TABS  Daily        05/24/23 1727    cephALEXin (KEFLEX) 500 MG capsule  3 times daily        05/24/23 1814    Ambulatory referral to Obstetrics / Gynecology       Comments: Establish care- pregnancy, subchorionic hematoma, 10 w   05/24/23 1817               Lindsey Demonte A, PA-C 05/24/23 1817    Alvira Monday, MD 05/25/23 2216

## 2023-05-24 NOTE — ED Triage Notes (Signed)
The patient is having abd pain since Wednesday that comes and goes. She is pregnant. She has had some N/D also.

## 2023-05-26 LAB — GC/CHLAMYDIA PROBE AMP (~~LOC~~) NOT AT ARMC
Chlamydia: NEGATIVE
Comment: NEGATIVE
Comment: NORMAL
Neisseria Gonorrhea: NEGATIVE

## 2023-05-27 LAB — URINE CULTURE: Culture: 30000 — AB
